# Patient Record
Sex: Female | Born: 1950
Health system: Southern US, Community
[De-identification: ages and names within clinical notes are randomized; demographics above are authoritative.]

## PROBLEM LIST (undated history)

## (undated) DIAGNOSIS — H9190 Unspecified hearing loss, unspecified ear: Secondary | ICD-10-CM

## (undated) DIAGNOSIS — E785 Hyperlipidemia, unspecified: Secondary | ICD-10-CM

## (undated) DIAGNOSIS — C801 Malignant (primary) neoplasm, unspecified: Secondary | ICD-10-CM

## (undated) DIAGNOSIS — F419 Anxiety disorder, unspecified: Secondary | ICD-10-CM

## (undated) DIAGNOSIS — I639 Cerebral infarction, unspecified: Secondary | ICD-10-CM

## (undated) HISTORY — PX: COLON SURGERY: SHX602

## (undated) HISTORY — PX: BREAST CYST EXCISION: SHX579

## (undated) HISTORY — DX: Hyperlipidemia, unspecified: E78.5

## (undated) HISTORY — DX: Malignant (primary) neoplasm, unspecified: C80.1

## (undated) HISTORY — PX: ABDOMINAL HYSTERECTOMY: SHX81

## (undated) HISTORY — DX: Cerebral infarction, unspecified: I63.9

---

## 1997-12-24 ENCOUNTER — Encounter: Payer: Self-pay | Admitting: Family Medicine

## 1997-12-24 ENCOUNTER — Ambulatory Visit (HOSPITAL_COMMUNITY): Admission: RE | Admit: 1997-12-24 | Discharge: 1997-12-24 | Payer: Self-pay | Admitting: Family Medicine

## 1997-12-28 ENCOUNTER — Ambulatory Visit (HOSPITAL_COMMUNITY): Admission: RE | Admit: 1997-12-28 | Discharge: 1997-12-28 | Payer: Self-pay | Admitting: *Deleted

## 1999-01-22 ENCOUNTER — Encounter: Admission: RE | Admit: 1999-01-22 | Discharge: 1999-01-22 | Payer: Self-pay | Admitting: Family Medicine

## 1999-06-16 ENCOUNTER — Ambulatory Visit (HOSPITAL_BASED_OUTPATIENT_CLINIC_OR_DEPARTMENT_OTHER): Admission: RE | Admit: 1999-06-16 | Discharge: 1999-06-16 | Payer: Self-pay | Admitting: Orthopedic Surgery

## 1999-11-05 ENCOUNTER — Encounter (INDEPENDENT_AMBULATORY_CARE_PROVIDER_SITE_OTHER): Payer: Self-pay | Admitting: *Deleted

## 1999-11-05 ENCOUNTER — Ambulatory Visit (HOSPITAL_COMMUNITY): Admission: RE | Admit: 1999-11-05 | Discharge: 1999-11-05 | Payer: Self-pay | Admitting: Gastroenterology

## 2000-06-18 ENCOUNTER — Encounter: Payer: Self-pay | Admitting: Family Medicine

## 2000-06-18 ENCOUNTER — Encounter: Admission: RE | Admit: 2000-06-18 | Discharge: 2000-06-18 | Payer: Self-pay | Admitting: Family Medicine

## 2000-06-19 ENCOUNTER — Emergency Department (HOSPITAL_COMMUNITY): Admission: EM | Admit: 2000-06-19 | Discharge: 2000-06-19 | Payer: Self-pay | Admitting: Emergency Medicine

## 2000-06-19 ENCOUNTER — Encounter: Payer: Self-pay | Admitting: Emergency Medicine

## 2002-09-26 ENCOUNTER — Ambulatory Visit (HOSPITAL_COMMUNITY): Admission: RE | Admit: 2002-09-26 | Discharge: 2002-09-26 | Payer: Self-pay | Admitting: Family Medicine

## 2002-09-26 ENCOUNTER — Encounter: Payer: Self-pay | Admitting: Family Medicine

## 2002-11-20 ENCOUNTER — Ambulatory Visit (HOSPITAL_COMMUNITY): Admission: RE | Admit: 2002-11-20 | Discharge: 2002-11-20 | Payer: Self-pay | Admitting: *Deleted

## 2002-11-20 ENCOUNTER — Encounter (INDEPENDENT_AMBULATORY_CARE_PROVIDER_SITE_OTHER): Payer: Self-pay

## 2003-04-03 ENCOUNTER — Encounter: Admission: RE | Admit: 2003-04-03 | Discharge: 2003-04-03 | Payer: Self-pay | Admitting: Family Medicine

## 2004-07-18 ENCOUNTER — Encounter: Admission: RE | Admit: 2004-07-18 | Discharge: 2004-07-18 | Payer: Self-pay | Admitting: Family Medicine

## 2004-10-29 ENCOUNTER — Emergency Department (HOSPITAL_COMMUNITY): Admission: EM | Admit: 2004-10-29 | Discharge: 2004-10-29 | Payer: Self-pay | Admitting: Emergency Medicine

## 2005-04-20 ENCOUNTER — Ambulatory Visit (HOSPITAL_COMMUNITY): Admission: RE | Admit: 2005-04-20 | Discharge: 2005-04-20 | Payer: Self-pay | Admitting: Family Medicine

## 2005-04-24 ENCOUNTER — Ambulatory Visit (HOSPITAL_COMMUNITY): Admission: RE | Admit: 2005-04-24 | Discharge: 2005-04-24 | Payer: Self-pay | Admitting: Family Medicine

## 2005-08-18 ENCOUNTER — Encounter: Admission: RE | Admit: 2005-08-18 | Discharge: 2005-08-18 | Payer: Self-pay | Admitting: Family Medicine

## 2006-06-07 ENCOUNTER — Encounter: Admission: RE | Admit: 2006-06-07 | Discharge: 2006-06-07 | Payer: Self-pay | Admitting: Gastroenterology

## 2007-05-10 ENCOUNTER — Encounter: Admission: RE | Admit: 2007-05-10 | Discharge: 2007-05-10 | Payer: Self-pay | Admitting: Family Medicine

## 2007-09-18 ENCOUNTER — Emergency Department (HOSPITAL_COMMUNITY): Admission: EM | Admit: 2007-09-18 | Discharge: 2007-09-18 | Payer: Self-pay | Admitting: Emergency Medicine

## 2008-04-27 ENCOUNTER — Emergency Department (HOSPITAL_COMMUNITY): Admission: EM | Admit: 2008-04-27 | Discharge: 2008-04-27 | Payer: Self-pay | Admitting: *Deleted

## 2008-05-06 ENCOUNTER — Encounter: Admission: RE | Admit: 2008-05-06 | Discharge: 2008-05-06 | Payer: Self-pay | Admitting: Otolaryngology

## 2008-06-05 ENCOUNTER — Encounter: Admission: RE | Admit: 2008-06-05 | Discharge: 2008-06-05 | Payer: Self-pay | Admitting: Family Medicine

## 2008-10-09 ENCOUNTER — Ambulatory Visit (HOSPITAL_COMMUNITY): Admission: RE | Admit: 2008-10-09 | Discharge: 2008-10-09 | Payer: Self-pay | Admitting: Family Medicine

## 2008-12-24 ENCOUNTER — Emergency Department (HOSPITAL_COMMUNITY): Admission: EM | Admit: 2008-12-24 | Discharge: 2008-12-24 | Payer: Self-pay | Admitting: Emergency Medicine

## 2009-06-27 ENCOUNTER — Encounter: Admission: RE | Admit: 2009-06-27 | Discharge: 2009-06-27 | Payer: Self-pay | Admitting: Family Medicine

## 2009-11-11 ENCOUNTER — Encounter (INDEPENDENT_AMBULATORY_CARE_PROVIDER_SITE_OTHER): Payer: Self-pay | Admitting: General Surgery

## 2009-11-11 ENCOUNTER — Inpatient Hospital Stay (HOSPITAL_COMMUNITY): Admission: RE | Admit: 2009-11-11 | Discharge: 2009-11-15 | Payer: Self-pay | Admitting: General Surgery

## 2009-11-11 DIAGNOSIS — C801 Malignant (primary) neoplasm, unspecified: Secondary | ICD-10-CM

## 2009-11-11 HISTORY — DX: Malignant (primary) neoplasm, unspecified: C80.1

## 2009-11-26 ENCOUNTER — Ambulatory Visit: Payer: Self-pay | Admitting: Hematology and Oncology

## 2009-12-04 LAB — CBC WITH DIFFERENTIAL/PLATELET
Basophils Absolute: 0.1 10*3/uL (ref 0.0–0.1)
EOS%: 0.9 % (ref 0.0–7.0)
Eosinophils Absolute: 0.1 10*3/uL (ref 0.0–0.5)
HGB: 13.7 g/dL (ref 11.6–15.9)
LYMPH%: 25.3 % (ref 14.0–49.7)
MCH: 30.4 pg (ref 25.1–34.0)
MCV: 89.7 fL (ref 79.5–101.0)
MONO%: 4.2 % (ref 0.0–14.0)
Platelets: 341 10*3/uL (ref 145–400)
RBC: 4.5 10*6/uL (ref 3.70–5.45)
RDW: 13.3 % (ref 11.2–14.5)

## 2009-12-04 LAB — COMPREHENSIVE METABOLIC PANEL
AST: 15 U/L (ref 0–37)
Albumin: 4.2 g/dL (ref 3.5–5.2)
Alkaline Phosphatase: 47 U/L (ref 39–117)
BUN: 9 mg/dL (ref 6–23)
Glucose, Bld: 90 mg/dL (ref 70–99)
Potassium: 3.6 mEq/L (ref 3.5–5.3)
Total Bilirubin: 0.8 mg/dL (ref 0.3–1.2)

## 2009-12-16 ENCOUNTER — Ambulatory Visit (HOSPITAL_COMMUNITY): Admission: RE | Admit: 2009-12-16 | Discharge: 2009-12-16 | Payer: Self-pay | Admitting: Hematology and Oncology

## 2010-03-16 ENCOUNTER — Encounter: Payer: Self-pay | Admitting: Family Medicine

## 2010-05-08 LAB — CBC
HCT: 38.4 % (ref 36.0–46.0)
Hemoglobin: 12.2 g/dL (ref 12.0–15.0)
Hemoglobin: 12.4 g/dL (ref 12.0–15.0)
MCH: 29.7 pg (ref 26.0–34.0)
MCH: 30.6 pg (ref 26.0–34.0)
MCHC: 31.6 g/dL (ref 30.0–36.0)
MCHC: 33.6 g/dL (ref 30.0–36.0)
MCV: 93.7 fL (ref 78.0–100.0)
Platelets: 213 10*3/uL (ref 150–400)
Platelets: 252 10*3/uL (ref 150–400)
RBC: 4.1 MIL/uL (ref 3.87–5.11)
RDW: 13.5 % (ref 11.5–15.5)

## 2010-05-08 LAB — DIFFERENTIAL
Basophils Relative: 0 % (ref 0–1)
Eosinophils Absolute: 0 10*3/uL (ref 0.0–0.7)
Eosinophils Absolute: 0 10*3/uL (ref 0.0–0.7)
Eosinophils Relative: 0 % (ref 0–5)
Eosinophils Relative: 1 % (ref 0–5)
Lymphocytes Relative: 33 % (ref 12–46)
Lymphs Abs: 2 10*3/uL (ref 0.7–4.0)
Lymphs Abs: 3.4 10*3/uL (ref 0.7–4.0)
Monocytes Absolute: 0.6 10*3/uL (ref 0.1–1.0)
Monocytes Absolute: 0.7 10*3/uL (ref 0.1–1.0)
Monocytes Absolute: 0.6 10*3/uL (ref 0.1–1.0)
Monocytes Relative: 6 % (ref 3–12)
Monocytes Relative: 6 % (ref 3–12)
Neutrophils Relative %: 74 % (ref 43–77)
Neutrophils Relative %: 76 % (ref 43–77)

## 2010-05-08 LAB — BASIC METABOLIC PANEL
BUN: 2 mg/dL — ABNORMAL LOW (ref 6–23)
CO2: 28 mEq/L (ref 19–32)
CO2: 29 mEq/L (ref 19–32)
Calcium: 8.7 mg/dL (ref 8.4–10.5)
Chloride: 103 mEq/L (ref 96–112)
GFR calc Af Amer: >60 mL/min (ref >60)
Glucose, Bld: 113 mg/dL — ABNORMAL HIGH (ref 70–99)
Glucose, Bld: 117 mg/dL — ABNORMAL HIGH (ref 70–99)
Potassium: 3.9 mEq/L (ref 3.5–5.1)
Sodium: 138 mEq/L (ref 135–145)
Sodium: 141 mEq/L (ref 135–145)

## 2010-05-08 LAB — COMPREHENSIVE METABOLIC PANEL
AST: 18 U/L (ref 0–37)
Albumin: 4.4 g/dL (ref 3.5–5.2)
Calcium: 10.6 mg/dL — ABNORMAL HIGH (ref 8.4–10.5)
Creatinine, Ser: 0.97 mg/dL (ref 0.4–1.2)
GFR calc Af Amer: >60 mL/min (ref >60)

## 2010-05-08 LAB — SURGICAL PCR SCREEN: Staphylococcus aureus: NEGATIVE

## 2010-05-29 ENCOUNTER — Other Ambulatory Visit: Payer: Self-pay | Admitting: Gastroenterology

## 2010-06-05 LAB — DIFFERENTIAL
Basophils Absolute: 0 10*3/uL (ref 0.0–0.1)
Basophils Relative: 1 % (ref 0–1)
Eosinophils Absolute: 0.1 10*3/uL (ref 0.0–0.7)
Lymphs Abs: 3.2 10*3/uL (ref 0.7–4.0)
Neutrophils Relative %: 53 % (ref 43–77)

## 2010-06-05 LAB — URINALYSIS, ROUTINE W REFLEX MICROSCOPIC
Bilirubin Urine: NEGATIVE
Glucose, UA: NEGATIVE mg/dL
Ketones, ur: NEGATIVE mg/dL
Protein, ur: NEGATIVE mg/dL
pH: 6 (ref 5.0–8.0)

## 2010-06-05 LAB — URINE MICROSCOPIC-ADD ON

## 2010-06-05 LAB — CBC
Hemoglobin: 13.9 g/dL (ref 12.0–15.0)
MCHC: 33.9 g/dL (ref 30.0–36.0)
MCV: 90 fL (ref 78.0–100.0)
RBC: 4.54 MIL/uL (ref 3.87–5.11)

## 2010-06-05 LAB — COMPREHENSIVE METABOLIC PANEL
ALT: 11 U/L (ref 0–35)
CO2: 27 mEq/L (ref 19–32)
Calcium: 9 mg/dL (ref 8.4–10.5)
Chloride: 104 mEq/L (ref 96–112)
GFR calc non Af Amer: >60 mL/min (ref >60)
Glucose, Bld: 94 mg/dL (ref 70–99)
Sodium: 138 mEq/L (ref 135–145)
Total Bilirubin: 0.3 mg/dL (ref 0.3–1.2)

## 2010-06-05 LAB — PROTIME-INR
INR: 1 (ref 0.00–1.49)
Prothrombin Time: 13.1 seconds (ref 11.6–15.2)

## 2010-06-05 LAB — URINE CULTURE: Colony Count: NO GROWTH

## 2010-07-01 ENCOUNTER — Other Ambulatory Visit: Payer: Self-pay | Admitting: Family Medicine

## 2010-07-01 DIAGNOSIS — Z1231 Encounter for screening mammogram for malignant neoplasm of breast: Secondary | ICD-10-CM

## 2010-07-08 ENCOUNTER — Ambulatory Visit
Admission: RE | Admit: 2010-07-08 | Discharge: 2010-07-08 | Disposition: A | Discharge status: Home / Self Care | Payer: BC Managed Care – PPO | Source: Ambulatory Visit | Attending: Family Medicine | Admitting: Family Medicine

## 2010-07-08 DIAGNOSIS — Z1231 Encounter for screening mammogram for malignant neoplasm of breast: Secondary | ICD-10-CM

## 2010-07-11 NOTE — Op Note (Signed)
[place]. [facility]  Patient:    Sonia Oliver, Sonia Oliver                        MRN: [id number] Proc. Date: 06/16/99 Adm. Date:  [id number] Attending:  Twana First                           Operative Report  PREOPERATIVE DIAGNOSIS:   Left medial meniscus tear.  POSTOPERATIVE DIAGNOSIS: 1. Left knee meniscus tear. 1. Left knee patellofemoral chondromalacia.  OPERATION:   Left knee examination under anesthesia followed by arthroscopic partial medial meniscectomy with patellofemoral chondroplasty.  SURGEON:  Elana Alm. Thurston Hole, M.D.  ASSISTANT:  Kirstin Adelberger, P.A.  ANESTHESIA:  Local and MAC.  OPERATIVE TIME:  30 minutes.  COMPLICATIONS:  None.  INDICATIONS FOR PROCEDURE:  [name] is a 60 year old woman who has had significantly increasing left knee pain over the past three to four months secondary to a squatting episode with signs and symptoms consistent with a medial meniscus tear and has failed conservative care and is now to undergo arthroscopy.  DESCRIPTION OF PROCEDURE:  [name] was brought to the operating room on June 16, 1999, after a block had been placed in the holding room.  She was placed on the operating table in a supine position.  After an adequate level of general anesthesia was obtained, her left knee was examined under anesthesia.  Range of motion from 0 to 125 degrees, 1+ ______ , stable ligamentous exam with normal patella tracking.  After this was done, the knee was prepped using sterile Betadine and draped using sterile technique.  Originally through an inferolateral portal, the arthroscope with pump attached was placed into an inferomedial portal, and arthroscopic probe was placed.  On initial inspection of the medial compartment, the articular cartilage of mediofemoral condyle, medial tibial plateau was intact.  Medial meniscus showed a split complex tear of the posterior and medial horn of the medial meniscus of  which 30 to 40% was resected back to a stable rim.  The area of condylar notch was inspected, anterior and posterior cruciate ligaments were normal.  Lateral compartment was inspected.  Articular cartilage, lateral femoral condyle, and lateral tibial plateau was normal.  Lateral meniscus was probed, and this was found to be normal.  Patellofemoral joint was inspected.  The articular cartilage on the patellofemoral joint showed a grade III chondral lesion over the medial patellar facet articulating with another grade III lesion over the mediofemoral condyle at about 60 to 80 degrees of flexion.  This was thoroughly debrided down to a stable base and a stable rim.  The rest of the articular cartilage in the patellofemoral joint was normal.  Moderate synovitis in the mediolateral gutters were debrided.  Otherwise, they were free of pathology.  After this was done, it was felt that all pathology had been satisfactorily addressed.  The instruments were removed.  Portals were closed with 3-0 nylon suture and injected with 0.25% Marcaine with epinephrine and 5 mg of morphine.  Sterile dressing was applied, and the patient was awakened and taken to the recovery room in stable condition.  FOLLOWUP CARE:  [name] will be followed as an outpatient on Vicodin and Naprosyn.  She will be back in the office in a week for sutures out and followup. DD:  06/16/99 TD:  06/16/99 Job: [id number]

## 2010-07-11 NOTE — Op Note (Signed)
[place]. [facility]  Patient:    MELONI, HINZ                        MRN: [id number] Proc. Date: 11/05/99 Adm. Date:  [id number] Attending:  Charna Elizabeth CC:         Adolph Pollack, M.D.   Operative Report  DATE OF BIRTH:  [birth year].  PROCEDURE PERFORMED:  Colonoscopy with snare polypectomy x 1.  ENDOSCOPIST:  Anselmo Rod, M.D.  INSTRUMENTS USED:  Olympus video colonoscope.  INDICATIONS FOR PROCEDURE:  Change in bowel habits and a previous history of polyps removed three years ago.  Rule out recurrent polyps.  PREPROCEDURE PREPARATION:  Informed consent was procured from the patient. The patient was fasted for eight hours prior to the procedure and prepped with a bottle of magnesium citrate and a gallon of NuLytely the night prior to the procedure.  PREPROCEDURE PHYSICAL:  VITAL SIGNS:  The patient had stable vital signs.  NECK:  Supple.  CHEST:  Clear to auscultation.  S1, S2 regular.  No murmur, rub, or gallop, rales, rhonchi, or wheezing.  ABDOMEN:  Soft with normal abdominal bowel sounds.  DESCRIPTION OF PROCEDURE:  The patient was placed in the left lateral decubitus position and sedated with 50 mg of Demerol and 5 mg of Versed intravenously.  Once the patient was adequately sedate and maintained on low-flow oxygen and continuous cardiac monitoring, the Olympus video colonoscope was advanced from the rectum to the cecum with slight difficulty secondary to some residual stool in the right colon and cecum.  A small sessile polyp was removed from 50 cm via snare polypectomy.  No large masses or polyps were seen.  The procedure was completed through the cecum.  A very small lesion may have been missed in the right colon secondary to a relatively poor prep.  IMPRESSION: 1. Small sessile polyp snared at 50 cm. 2. No large masses or polyps seen. 3. Some residual stool in the right colon and cecum.  Very small lesion may  have been missed.  RECOMMENDATIONS: 1. Await pathology results. 2. Outpatient followup in the next two weeks. 3. Repeat colorectal cancer screening to be scheduled in the near future    depending on the pathology results. DD:  11/05/99 TD:  11/06/99 Job: [id number]

## 2010-11-21 LAB — URINALYSIS, ROUTINE W REFLEX MICROSCOPIC
Glucose, UA: NEGATIVE
Ketones, ur: NEGATIVE
Protein, ur: NEGATIVE
pH: 6.5

## 2010-11-21 LAB — COMPREHENSIVE METABOLIC PANEL
ALT: 12
AST: 15
CO2: 26
Calcium: 9.4
Chloride: 103
GFR calc Af Amer: >60
GFR calc non Af Amer: >60
Glucose, Bld: 98
Sodium: 137
Total Bilirubin: 0.4

## 2010-11-21 LAB — DIFFERENTIAL
Basophils Absolute: 0
Basophils Relative: 1
Eosinophils Absolute: 0.1
Eosinophils Relative: 1
Lymphs Abs: 2
Neutrophils Relative %: 67

## 2010-11-21 LAB — CBC
Hemoglobin: 13.3
MCHC: 33.4
MCV: 91
RBC: 4.37
WBC: 7.5

## 2010-11-21 LAB — URINE MICROSCOPIC-ADD ON

## 2010-12-22 ENCOUNTER — Encounter (INDEPENDENT_AMBULATORY_CARE_PROVIDER_SITE_OTHER): Payer: Self-pay | Admitting: General Surgery

## 2011-06-03 ENCOUNTER — Ambulatory Visit (HOSPITAL_COMMUNITY)
Admission: RE | Admit: 2011-06-03 | Discharge: 2011-06-03 | Disposition: A | Discharge status: Home / Self Care | Payer: Medicare Other | Source: Ambulatory Visit | Attending: Family Medicine | Admitting: Family Medicine

## 2011-06-03 ENCOUNTER — Other Ambulatory Visit (HOSPITAL_COMMUNITY): Payer: Self-pay | Admitting: Family Medicine

## 2011-06-03 DIAGNOSIS — Z23 Encounter for immunization: Secondary | ICD-10-CM | POA: Diagnosis not present

## 2011-06-03 DIAGNOSIS — F17200 Nicotine dependence, unspecified, uncomplicated: Secondary | ICD-10-CM | POA: Diagnosis not present

## 2011-06-03 DIAGNOSIS — Z85038 Personal history of other malignant neoplasm of large intestine: Secondary | ICD-10-CM | POA: Diagnosis not present

## 2011-06-03 DIAGNOSIS — S61209A Unspecified open wound of unspecified finger without damage to nail, initial encounter: Secondary | ICD-10-CM | POA: Diagnosis not present

## 2011-06-03 DIAGNOSIS — R079 Chest pain, unspecified: Secondary | ICD-10-CM

## 2011-06-03 DIAGNOSIS — IMO0002 Reserved for concepts with insufficient information to code with codable children: Secondary | ICD-10-CM | POA: Diagnosis not present

## 2011-06-04 DIAGNOSIS — I10 Essential (primary) hypertension: Secondary | ICD-10-CM | POA: Diagnosis not present

## 2011-06-04 DIAGNOSIS — R0789 Other chest pain: Secondary | ICD-10-CM | POA: Diagnosis not present

## 2011-07-10 ENCOUNTER — Other Ambulatory Visit: Payer: Self-pay | Admitting: Family Medicine

## 2011-07-10 DIAGNOSIS — Z1231 Encounter for screening mammogram for malignant neoplasm of breast: Secondary | ICD-10-CM

## 2011-07-10 DIAGNOSIS — Z803 Family history of malignant neoplasm of breast: Secondary | ICD-10-CM

## 2011-07-24 ENCOUNTER — Ambulatory Visit
Admission: RE | Admit: 2011-07-24 | Discharge: 2011-07-24 | Disposition: A | Discharge status: Home / Self Care | Payer: Medicare Other | Source: Ambulatory Visit | Attending: Family Medicine | Admitting: Family Medicine

## 2011-07-24 DIAGNOSIS — Z803 Family history of malignant neoplasm of breast: Secondary | ICD-10-CM

## 2011-07-24 DIAGNOSIS — Z1231 Encounter for screening mammogram for malignant neoplasm of breast: Secondary | ICD-10-CM

## 2012-05-30 DIAGNOSIS — K5730 Diverticulosis of large intestine without perforation or abscess without bleeding: Secondary | ICD-10-CM | POA: Diagnosis not present

## 2012-05-30 DIAGNOSIS — Z85038 Personal history of other malignant neoplasm of large intestine: Secondary | ICD-10-CM | POA: Diagnosis not present

## 2012-05-30 DIAGNOSIS — Z09 Encounter for follow-up examination after completed treatment for conditions other than malignant neoplasm: Secondary | ICD-10-CM | POA: Diagnosis not present

## 2012-08-09 DIAGNOSIS — H6980 Other specified disorders of Eustachian tube, unspecified ear: Secondary | ICD-10-CM | POA: Diagnosis not present

## 2012-08-09 DIAGNOSIS — IMO0002 Reserved for concepts with insufficient information to code with codable children: Secondary | ICD-10-CM | POA: Diagnosis not present

## 2012-08-09 DIAGNOSIS — J019 Acute sinusitis, unspecified: Secondary | ICD-10-CM | POA: Diagnosis not present

## 2012-09-05 ENCOUNTER — Other Ambulatory Visit: Payer: Self-pay

## 2012-09-05 DIAGNOSIS — Z1231 Encounter for screening mammogram for malignant neoplasm of breast: Secondary | ICD-10-CM

## 2012-09-22 ENCOUNTER — Ambulatory Visit
Admission: RE | Admit: 2012-09-22 | Discharge: 2012-09-22 | Disposition: A | Discharge status: Home / Self Care | Payer: Medicare Other | Source: Ambulatory Visit

## 2012-09-22 DIAGNOSIS — Z1231 Encounter for screening mammogram for malignant neoplasm of breast: Secondary | ICD-10-CM

## 2012-12-04 DIAGNOSIS — Z23 Encounter for immunization: Secondary | ICD-10-CM | POA: Diagnosis not present

## 2013-03-22 DIAGNOSIS — IMO0002 Reserved for concepts with insufficient information to code with codable children: Secondary | ICD-10-CM | POA: Diagnosis not present

## 2013-03-22 DIAGNOSIS — M67919 Unspecified disorder of synovium and tendon, unspecified shoulder: Secondary | ICD-10-CM | POA: Diagnosis not present

## 2013-03-22 DIAGNOSIS — M7500 Adhesive capsulitis of unspecified shoulder: Secondary | ICD-10-CM | POA: Diagnosis not present

## 2013-05-25 DIAGNOSIS — F4321 Adjustment disorder with depressed mood: Secondary | ICD-10-CM | POA: Diagnosis not present

## 2013-05-25 DIAGNOSIS — Z01419 Encounter for gynecological examination (general) (routine) without abnormal findings: Secondary | ICD-10-CM | POA: Diagnosis not present

## 2013-05-25 DIAGNOSIS — IMO0002 Reserved for concepts with insufficient information to code with codable children: Secondary | ICD-10-CM | POA: Diagnosis not present

## 2013-05-25 DIAGNOSIS — Z79899 Other long term (current) drug therapy: Secondary | ICD-10-CM | POA: Diagnosis not present

## 2013-05-25 DIAGNOSIS — Z0000 Encounter for general adult medical examination without abnormal findings: Secondary | ICD-10-CM | POA: Diagnosis not present

## 2013-07-19 DIAGNOSIS — IMO0002 Reserved for concepts with insufficient information to code with codable children: Secondary | ICD-10-CM | POA: Diagnosis not present

## 2013-07-19 DIAGNOSIS — M25519 Pain in unspecified shoulder: Secondary | ICD-10-CM | POA: Diagnosis not present

## 2013-08-01 ENCOUNTER — Encounter: Payer: Self-pay | Admitting: Orthopedic Surgery

## 2013-08-01 ENCOUNTER — Ambulatory Visit (INDEPENDENT_AMBULATORY_CARE_PROVIDER_SITE_OTHER): Payer: Medicare Other | Admitting: Orthopedic Surgery

## 2013-08-01 ENCOUNTER — Ambulatory Visit (INDEPENDENT_AMBULATORY_CARE_PROVIDER_SITE_OTHER): Payer: Medicare Other

## 2013-08-01 VITALS — BP 135/70

## 2013-08-01 DIAGNOSIS — M25519 Pain in unspecified shoulder: Secondary | ICD-10-CM

## 2013-08-01 DIAGNOSIS — M7500 Adhesive capsulitis of unspecified shoulder: Secondary | ICD-10-CM

## 2013-08-01 DIAGNOSIS — M25512 Pain in left shoulder: Secondary | ICD-10-CM

## 2013-08-01 NOTE — Patient Instructions (Addendum)
You Can Quit Smoking If you are ready to quit smoking or are thinking about it, congratulations! You have chosen to help yourself be healthier and live longer! There are lots of different ways to quit smoking. Nicotine gum, nicotine patches, a nicotine inhaler, or nicotine nasal spray can help with physical craving. Hypnosis, support groups, and medicines help break the habit of smoking. TIPS TO GET OFF AND STAY OFF CIGARETTES  Learn to predict your moods. Do not let a bad situation be your excuse to have a cigarette. Some situations in your life might tempt you to have a cigarette.  Ask friends and co-workers not to smoke around you.  Make your home smoke-free.  Never have "just one" cigarette. It leads to wanting another and another. Remind yourself of your decision to quit.  On a card, make a list of your reasons for not smoking. Read it at least the same number of times a day as you have a cigarette. Tell yourself everyday, "I do not want to smoke. I choose not to smoke."  Ask someone at home or work to help you with your plan to quit smoking.  Have something planned after you eat or have a cup of coffee. Take a walk or get other exercise to perk you up. This will help to keep you from overeating.  Try a relaxation exercise to calm you down and decrease your stress. Remember, you may be tense and nervous the first two weeks after you quit. This will pass.  Find new activities to keep your hands busy. Play with a pen, coin, or rubber band. Doodle or draw things on paper.  Brush your teeth right after eating. This will help cut down the craving for the taste of tobacco after meals. You can try mouthwash too.  Try gum, breath mints, or diet candy to keep something in your mouth. IF YOU SMOKE AND WANT TO QUIT:  Do not stock up on cigarettes. Never buy a carton. Wait until one pack is finished before you buy another.  Never carry cigarettes with you at work or at home.  Keep cigarettes  as far away from you as possible. Leave them with someone else.  Never carry matches or a lighter with you.  Ask yourself, "Do I need this cigarette or is this just a reflex?"  Bet with someone that you can quit. Put cigarette money in a piggy bank every morning. If you smoke, you give up the money. If you do not smoke, by the end of the week, you keep the money.  Keep trying. It takes 21 days to change a habit!  Talk to your doctor about using medicines to help you quit. These include nicotine replacement gum, lozenges, or skin patches. Document Released: 12/06/2008 Document Revised: 05/04/2011 Document Reviewed: 12/06/2008 [facility] Patient Information 2014 [place].  Impingement Syndrome, Rotator Cuff, Bursitis  Impingement syndrome is a condition that involves inflammation of the tendons of the rotator cuff and the subacromial bursa, that causes pain in the shoulder. The rotator cuff consists of four tendons and muscles that control much of the shoulder and upper arm function. The subacromial bursa is a fluid filled sac that helps reduce friction between the rotator cuff and one of the bones of the shoulder (acromion). Impingement syndrome is usually an overuse injury that causes swelling of the bursa (bursitis), swelling of the tendon (tendonitis), and/or a tear of the tendon (strain). Strains are classified into three categories. Grade 1 strains cause pain,  but the tendon is not lengthened. Grade 2 strains include a lengthened ligament, due to the ligament being stretched or partially ruptured. With grade 2 strains there is still function, although the function may be decreased. Grade 3 strains include a complete tear of the tendon or muscle, and function is usually impaired. SYMPTOMS   Pain around the shoulder, often at the outer portion of the upper arm.  Pain that gets worse with shoulder function, especially when reaching overhead or lifting.  Sometimes, aching when not using  the arm.  Pain that wakes you up at night.  Sometimes, tenderness, swelling, warmth, or redness over the affected area.  Loss of strength.  Limited motion of the shoulder, especially reaching behind the back (to the back pocket or to unhook bra) or across your body.  Crackling sound (crepitation) when moving the arm.  Biceps tendon pain and inflammation (in the front of the shoulder). Worse when bending the elbow or lifting. CAUSES  Impingement syndrome is often an overuse injury, in which chronic (repetitive) motions cause the tendons or bursa to become inflamed. A strain occurs when a force is paced on the tendon or muscle that is greater than it can withstand. Common mechanisms of injury include: Stress from sudden increase in duration, frequency, or intensity of training.  Direct hit (trauma) to the shoulder.  Aging, erosion of the tendon with normal use.  Bony bump on shoulder (acromial spur). RISK INCREASES WITH:  Contact sports (football, wrestling, boxing).  Throwing sports (baseball, tennis, volleyball).  Weightlifting and bodybuilding.  Heavy labor.  Previous injury to the rotator cuff, including impingement.  Poor shoulder strength and flexibility.  Failure to warm up properly before activity.  Inadequate protective equipment.  Old age.  Bony bump on shoulder (acromial spur). PREVENTION   Warm up and stretch properly before activity.  Allow for adequate recovery between workouts.  Maintain physical fitness:  Strength, flexibility, and endurance.  Cardiovascular fitness.  Learn and use proper exercise technique. PROGNOSIS  If treated properly, impingement syndrome usually goes away within 6 weeks. Sometimes surgery is required.  RELATED COMPLICATIONS   Longer healing time if not properly treated, or if not given enough time to heal.  Recurring symptoms, that result in a chronic condition.  Shoulder stiffness, frozen shoulder, or loss of  motion.  Rotator cuff tendon tear.  Recurring symptoms, especially if activity is resumed too soon, with overuse, with a direct blow, or when using poor technique. TREATMENT  Treatment first involves the use of ice and medicine, to reduce pain and inflammation. The use of strengthening and stretching exercises may help reduce pain with activity. These exercises may be performed at home or with a therapist. If non-surgical treatment is unsuccessful after more than 6 months, surgery may be advised. After surgery and rehabilitation, activity is usually possible in 3 months.  MEDICATION  If pain medicine is needed, nonsteroidal anti-inflammatory medicines (aspirin and ibuprofen), or other minor pain relievers (acetaminophen), are often advised.  Do not take pain medicine for 7 days before surgery.  Prescription pain relievers may be given, if your caregiver thinks they are needed. Use only as directed and only as much as you need.  Corticosteroid injections may be given by your caregiver. These injections should be reserved for the most serious cases, because they may only be given a certain number of times. HEAT AND COLD  Cold treatment (icing) should be applied for 10 to 15 minutes every 2 to 3 hours for inflammation and  pain, and immediately after activity that aggravates your symptoms. Use ice packs or an ice massage.  Heat treatment may be used before performing stretching and strengthening activities prescribed by your caregiver, physical therapist, or athletic trainer. Use a heat pack or a warm water soak. SEEK MEDICAL CARE IF:   Symptoms get worse or do not improve in 4 to 6 weeks, despite treatment.  New, unexplained symptoms develop. (Drugs used in treatment may produce side effects.)  Call to arrange therapy Do home exercises for 6 weeks

## 2013-08-01 NOTE — Progress Notes (Signed)
Patient ID: Sonia Oliver, female   DOB: 1950-08-18, 63 y.o.   MRN: [id number]  Chief Complaint  Patient presents with  . Shoulder Pain    left shoulder pain. Referred by Delman Cheadle, PA    History this is a 63 year old female who presents with a five-month history of atraumatic onset of left shoulder pain. She is having or experiencing pain and stiffness and dull aching over the left anterior and lateral shoulder which has become constant. Pain intensity is 5/10. She's had minimal relief from 2-3 months of naproxen 500 mg twice a day. She does have night pain and increased pain with cold weather. She has decreased internal rotation as well as forward elevation.  Medical history includes a history of gastroesophageal reflux disease and cancer of the colon. She had a partial hysterectomy and 75 had colon surgery in 2011 show cystectomy of the left breast.  Current medications include naproxen 500 mg, alprazolam 0.5 mg half tab at bedtime aspirin 81 mg daily. Allegra 180 mg a day cholesterol 900 mg and vitamin D 3. Allergies to medication include sulfa which gives her a sick feeling codeine which affects her breathing  Family history of diabetes ulcers gastrointestinal disease heart disease hypertension stroke heart attack coronary artery disease cancer arthritis osteoporosis seizure disorder depression thyroid disease  Review of systems she does have some difficulty sleeping trouble with her hearing some sinusitis problems. She has anxiety and otherwise normal system review  Her height is 5 3-1/2 her weight 138 pounds BP 135/70  General appearance: Development, nutrition are normal. Body habitus medium No gross deformities are noted and grooming normal.  Peripheral vascular system no swelling or varicose veins are noted and pulses are palpable without tenderness, temperature warm to touch no edema.  No palpable lymph nodes are noted in the cervical area or axillae.  The skin overlying  the right and left shoulder cervical and thoracic spine is normal without rash, lesion or ulceration  Deep tendon reflexes are normal and equal. And pathologic reflexes such as Hoffman sign are negative.  Sensation remains normal.  The patient is oriented to person place and time, the mood and affect are normal  Ambulation remains normal  Cervical spine no mass or tenderness. Range of motion is normal. Muscle tone is normal. Skin is normal.  Right shoulder inspection reveals no tenderness or malalignment. There is no crepitation. The range of motion remains full flexion internal and external rotation. Stability tests are normal in abduction external rotation inferior subluxation test as well as the posterior stress test. Manual muscle testing of the supraspinatus, internal and external rotators 5 over 5  Impingement sign is normal, Hawkins maneuver normal, a.c. joint stress test normal.  There is tenderness around the anterior deltoid and the posterior aspect of the acromion, internal rotation is limited to her front pocket, forward elevation is limited. Internal rotation is limited to the front pocket The patient is stable in abduction external rotation. There is mild weakness of the supraspinatus tendon 4/5 with normal internal and external rotation strength are 5/5. The impingement sign is positive The a.c. joint stress test is normal A Hawkins maneuver is negative, normal  Medical decision making Data we ordered an x-ray was interpreted it is interpreted as  Diagnosis  Bursitis left shoulder  Resection x-ray, injection, home therapy.  Procedure inject subacromial space left shoulder Diagnosis rotator cuff syndrome left shoulder Medication Depo-Medrol 40 mg, 1 cc and lidocaine 1% 3 cc Verbal consent Timeout completed  The injection site was cleaned with alcohol and sprayed with ethyl chloride. From a posterior approach a 20-gauge needle was injected in the subacromial space.  The medication went in easily. There were no complications. The wound was covered with a sterile bandage. Appropriate precautions were given.   Followup if no improvement after 6 weeks

## 2013-08-07 ENCOUNTER — Ambulatory Visit (HOSPITAL_COMMUNITY): Payer: Medicare Other | Admitting: Specialist

## 2013-08-23 ENCOUNTER — Ambulatory Visit (HOSPITAL_COMMUNITY)
Admission: RE | Admit: 2013-08-23 | Discharge: 2013-08-23 | Disposition: A | Discharge status: Home / Self Care | Payer: Medicare Other | Source: Ambulatory Visit | Attending: Orthopedic Surgery | Admitting: Orthopedic Surgery

## 2013-08-23 DIAGNOSIS — M25519 Pain in unspecified shoulder: Secondary | ICD-10-CM | POA: Insufficient documentation

## 2013-08-23 DIAGNOSIS — M6281 Muscle weakness (generalized): Secondary | ICD-10-CM | POA: Diagnosis not present

## 2013-08-23 DIAGNOSIS — IMO0001 Reserved for inherently not codable concepts without codable children: Secondary | ICD-10-CM | POA: Diagnosis not present

## 2013-08-23 DIAGNOSIS — M25619 Stiffness of unspecified shoulder, not elsewhere classified: Secondary | ICD-10-CM | POA: Insufficient documentation

## 2013-08-23 DIAGNOSIS — M25612 Stiffness of left shoulder, not elsewhere classified: Secondary | ICD-10-CM | POA: Insufficient documentation

## 2013-08-23 NOTE — Evaluation (Signed)
Occupational Therapy Evaluation  Patient Details  Name: Sonia Oliver MRN: [id number] Date of Birth: 1950-06-04  Today's Date: 08/23/2013 Time: 1311-1350 OT Time Calculation (min): 39 min Eval 2'  Visit#: 1 of 8  Re-eval: 09/20/13  Assessment Diagnosis: Left Adhesive Capsulitis Surgical Date:  (n/a) Next MD Visit: July 21st  Authorization: Medicare, with no secondary insurance  Authorization Time Period: Before 10th visit  Authorization Visit#: 1 of 10   Past Medical History: No past medical history on file. Past Surgical History: No past surgical history on file.  Subjective Symptoms/Limitations Symptoms: "It was frozen back in January" Pertinent History: In january pt began expereincing a suddon loss of ROM in her left shoulder.  Pt was referred to Dr Aline Brochure, who gave pt cortizone injection and exercises for wall slides and pendulums.  Pt reports significnaly improved ROM since initla onset, but still has limitation.  Pt verbalizes inability tor each to the center of her back. Limitations: Reaching into flexion, abduction, IR/extension Special Tests: FOTO  Patient Stated Goals: "Get arm to functional fully" Pain Assessment Currently in Pain?: No/denies  Precautions/Restrictions  Precautions Precautions: None Restrictions Weight Bearing Restrictions: No  Balance Screening Balance Screen Has the patient fallen in the past 6 months: No  Prior [place] expects to be discharged to:: Private residence Living Arrangements: Spouse/significant other Available Help at Discharge: Family (Husband) Prior Function Level of Independence: Independent with basic ADLs;Independent with homemaking with ambulation Driving: Yes 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  Reaching to top shelves has improved. Dominant Hand: Right Vision - History Baseline Vision: Wears glasses only for reading  Cognition/Observation Cognition Overall Cognitive Status: Within Functional Limits for tasks assessed Arousal/Alertness: Awake/alert Orientation Level: Oriented X4  Sensation/Coordination/Edema Sensation Light Touch: Appears Intact Coordination Gross Motor Movements are Fluid and Coordinated: Yes Fine Motor Movements are Fluid and Coordinated: Yes  Additional Assessments LUE Assessment LUE Assessment: Exceptions to WFL LUE AROM (degrees) LUE Overall AROM Comments: Assessed in sitting, with ER/IR shoulder adducted Left Shoulder Flexion: 113 Degrees Left Shoulder ABduction: 105 Degrees Left Shoulder Internal Rotation: 99 Degrees Left Shoulder External Rotation: 29 Degrees LUE PROM (degrees) LUE Overall PROM Comments: Assess in supine, with ER/IR shoulder adducted Left Shoulder Flexion: 123 Degrees Left Shoulder ABduction: 73 Degrees Left Shoulder Internal Rotation: 101 Degrees Left Shoulder External Rotation: 34 Degrees LUE Strength LUE Overall Strength Comments: Assessed in sitting, with ER/IR shoulder adducted Left Shoulder Flexion: 3-/5 (5/5 strength within available range) Left Shoulder ABduction: 3-/5 (5/5 strength within available range) Left Shoulder Internal Rotation: 5/5 Left Shoulder External Rotation: 3-/5 (5/5 within available range) Palpation Palpation: Moderate fascial restrictions in left upper arm, upper trap, scapular, and anterior shoulder regions.    Occupational Therapy Assessment and Plan OT Assessment and Plan Clinical Impression Statement: Pt is presenting for outpatient OT services for adhesive capsulitis of her left shoulder.  Pt is experiencing decreased ROM, increased fascial restrictions, decreased strength, increased pain with movement, and decreased LUE functional use.  Pt is aso  very concerned about the cost of each tx session, due to only having Medicare to cover 80% of each visit.   Pt will benefit from skilled therapeutic intervention in order to improve on the following deficits: Increased fascial restricitons;Pain;Impaired UE functional use;Decreased strength;Decreased range of motion Rehab Potential: Good OT Frequency: Min 1X/week (May  transition to every other week, pending pt's decision about insurance coverage) OT Duration: 8 weeks OT Treatment/Interventions: Self-care/ADL training;Therapeutic exercise;Energy conservation;Manual therapy;Modalities;Therapeutic activities;Patient/family education OT Plan: P: Pt will benefit from skilled OT services to improve both PROM and AROM, decreased fascial restricions, decrease pain with movement, improve strength, and improve overall LUE functional use.    Treatment Plan: myofascial release and manual stretching, PROM, AAROM, AROM, general strengthening, proximal shoulder strengthening, and scapular stabilization.  Pt is interested in transitioning to HEP only plan, so consistent updates on HEP through treatment..    Goals Home Exercise Program Pt/caregiver will Perform Home Exercise Program: For increased ROM;For increased strengthening PT Goal: Perform Home Exercise Program - Progress: Goal set today Short Term Goals Time to Complete Short Term Goals: 4 weeks Short Term Goal 1: Pt will be educated on HEP Short Term Goal 2: Pt will improve LUE PROM to WNL for improved ability to reach to overhead shelves. Short Term Goal 3: Pt will decrease fascial restrictions to min for decreased pain with movement Short Term Goal 4: Pt will report pain of less than 3/10 with lifting her arm. Long Term Goals Long Term Goal 1: Pt will acheive highest level of functioning with all ADL, IADL, and leisure tasks Long Term Goal 2: Pt will improve LUE AROM to WNL for improved ability to reach into top kitchen shelves Long Term Goal 3: Pt will  decresase fascial restrictions to trace for decreased pain with ROM Long Term Goal 4: Pt will report pain of 1/10 or less with overhead reaching activities Long Term Goal 5: Pt will achieve strength of 5/5 in LUE for improved ability to engage in sustained overhead reaching activities. Additional Long Term Goals?: Yes Long Term Goal 6: Pt will report 75% improvement in ROM needed to wash her back using her LUE.  Problem List Patient Active Problem List   Diagnosis Date Noted  . Decreased range of motion of left shoulder 08/23/2013  . Muscle weakness (generalized) 08/23/2013    End of Session Activity Tolerance: Patient tolerated treatment well General Behavior During Therapy: WFL for tasks assessed/performed OT Plan of Care OT Home Exercise Plan: Towel Slides, seated dowel exercises, internal rotation towel stretch, and wall walks (with stretch) in flexion and abduction OT Patient Instructions: Explained and demonstrated.  Pt return demonstrated.  Handouts provided (scanned). Consulted and Agree with Plan of Care: Patient  GO Functional Assessment Tool Used: FOTO 59/100 (41% limited) Functional Limitation: Carrying, moving and handling objects Carrying, Moving and Handling Objects Current Status ([id number]): At least 40 percent but less than 60 percent impaired, limited or restricted Carrying, Moving and Handling Objects Goal Status [phone]): At least 20 percent but less than 40 percent impaired, limited or restricted  Bea Graff, [place]askins, OTR/L [phone]  08/23/2013, 3:50 PM   Physician Documentation Your signature is required to indicate approval of the treatment plan as stated above.  Please sign and either send electronically or make a copy of this report for your files and return this physician signed original.  Please mark one 1.__approve of plan  2. ___approve of plan with the following conditions.   ______________________________                                                           _____________________ Physician  Signature                                                                                                             Date SBNR

## 2013-08-30 ENCOUNTER — Telehealth (HOSPITAL_COMMUNITY): Payer: Self-pay

## 2013-08-31 ENCOUNTER — Inpatient Hospital Stay (HOSPITAL_COMMUNITY): Admission: RE | Admit: 2013-08-31 | Payer: Medicare Other | Source: Ambulatory Visit

## 2013-09-12 ENCOUNTER — Ambulatory Visit (INDEPENDENT_AMBULATORY_CARE_PROVIDER_SITE_OTHER): Payer: Medicare Other | Admitting: Orthopedic Surgery

## 2013-09-12 VITALS — BP 107/59 | Ht 64.0 in | Wt 130.0 lb

## 2013-09-12 DIAGNOSIS — J069 Acute upper respiratory infection, unspecified: Secondary | ICD-10-CM

## 2013-09-12 MED ORDER — AZITHROMYCIN 250 MG PO TABS
ORAL_TABLET | ORAL | Status: DC
Start: 2013-09-12 — End: 2014-06-22

## 2013-09-12 NOTE — Progress Notes (Signed)
Patient ID: Sonia Oliver, female   DOB: [birth year], 63 y.o.   MRN: [id number]  Chief Complaint  Patient presents with  . Follow-up    6 week recheck on left shoulder after injection and physical therapy.   BP 107/59  Ht 5\' 4"  (1.626 m)  Wt 130 lb (58.968 kg)  BMI 22.30 kg/m2  Her treatment adhesive capsulitis did well with home therapy after I injected her. That's doing great she's regained her motion she's been able to help with remodeling of her home her activities of daily living have improved back to normal however she's had a bout of upper respiratory cough congestion fever mucus production for the second time. She can get an appointment with her doctors. She had trouble breathing and some upper chest and sinus congestion  Is a significant cough  As far as the shoulder goes full range of motion normal strength stability scans intact good distal pulse normal sensation no tenderness or swelling  Impression  Encounter Diagnosis  Name Primary?  . Acute upper respiratory infections of unspecified site Yes   Adhesive capsulitis resolved  I discussed with her an antibiotic and I think Z-Pak is in order since she cannot see her doctor and she did have a fever and apical 2 breathing and productive cough she will still get an appointment with them. Shoulder followup as needed

## 2013-09-20 ENCOUNTER — Other Ambulatory Visit (HOSPITAL_COMMUNITY): Payer: Self-pay | Admitting: Family Medicine

## 2013-09-20 ENCOUNTER — Encounter (INDEPENDENT_AMBULATORY_CARE_PROVIDER_SITE_OTHER): Payer: Self-pay

## 2013-09-20 ENCOUNTER — Ambulatory Visit (HOSPITAL_COMMUNITY)
Admission: RE | Admit: 2013-09-20 | Discharge: 2013-09-20 | Disposition: A | Discharge status: Home / Self Care | Payer: Medicare Other | Source: Ambulatory Visit | Attending: Family Medicine | Admitting: Family Medicine

## 2013-09-20 DIAGNOSIS — F411 Generalized anxiety disorder: Secondary | ICD-10-CM | POA: Diagnosis not present

## 2013-09-20 DIAGNOSIS — J209 Acute bronchitis, unspecified: Secondary | ICD-10-CM

## 2013-09-20 DIAGNOSIS — R05 Cough: Secondary | ICD-10-CM | POA: Insufficient documentation

## 2013-09-20 DIAGNOSIS — R079 Chest pain, unspecified: Secondary | ICD-10-CM | POA: Insufficient documentation

## 2013-09-20 DIAGNOSIS — R059 Cough, unspecified: Secondary | ICD-10-CM | POA: Insufficient documentation

## 2013-09-20 DIAGNOSIS — IMO0002 Reserved for concepts with insufficient information to code with codable children: Secondary | ICD-10-CM | POA: Diagnosis not present

## 2013-09-29 ENCOUNTER — Other Ambulatory Visit: Payer: Self-pay

## 2013-09-29 DIAGNOSIS — Z1231 Encounter for screening mammogram for malignant neoplasm of breast: Secondary | ICD-10-CM

## 2013-10-10 ENCOUNTER — Ambulatory Visit
Admission: RE | Admit: 2013-10-10 | Discharge: 2013-10-10 | Disposition: A | Discharge status: Home / Self Care | Payer: Medicare Other | Source: Ambulatory Visit

## 2013-10-10 ENCOUNTER — Encounter (INDEPENDENT_AMBULATORY_CARE_PROVIDER_SITE_OTHER): Payer: Self-pay

## 2013-10-10 DIAGNOSIS — Z1231 Encounter for screening mammogram for malignant neoplasm of breast: Secondary | ICD-10-CM

## 2013-10-10 NOTE — Progress Notes (Signed)
  Patient Details  Name: Sonia Oliver MRN: [id number] Date of Birth: 1950/06/08  Today's Date: 10/10/2013 The above patient was discharged from OT on 10/10/2013 secondary to failure to return to clinic since 08/23/2013.  Ailene Ravel, OTR/L,CBIS   10/10/2013, 10:52 AM

## 2013-10-10 NOTE — Addendum Note (Signed)
Encounter addended by: Debby Bud, OT on: 10/10/2013 10:53 AM<BR>     Documentation filed: Episodes, Clinical Notes, Letters

## 2013-11-23 DIAGNOSIS — Z23 Encounter for immunization: Secondary | ICD-10-CM | POA: Diagnosis not present

## 2014-02-10 ENCOUNTER — Emergency Department (HOSPITAL_COMMUNITY)
Admission: EM | Admit: 2014-02-10 | Discharge: 2014-02-10 | Disposition: A | Discharge status: Home / Self Care | Payer: Medicare Other | Attending: Emergency Medicine | Admitting: Emergency Medicine

## 2014-02-10 ENCOUNTER — Encounter (HOSPITAL_COMMUNITY): Payer: Self-pay | Admitting: *Deleted

## 2014-02-10 DIAGNOSIS — H9190 Unspecified hearing loss, unspecified ear: Secondary | ICD-10-CM | POA: Insufficient documentation

## 2014-02-10 DIAGNOSIS — J069 Acute upper respiratory infection, unspecified: Secondary | ICD-10-CM | POA: Diagnosis not present

## 2014-02-10 DIAGNOSIS — Z792 Long term (current) use of antibiotics: Secondary | ICD-10-CM | POA: Insufficient documentation

## 2014-02-10 DIAGNOSIS — Z720 Tobacco use: Secondary | ICD-10-CM | POA: Diagnosis not present

## 2014-02-10 DIAGNOSIS — H9202 Otalgia, left ear: Secondary | ICD-10-CM | POA: Diagnosis present

## 2014-02-10 HISTORY — DX: Unspecified hearing loss, unspecified ear: H91.90

## 2014-02-10 LAB — RAPID STREP SCREEN (MED CTR MEBANE ONLY): Streptococcus, Group A Screen (Direct): NEGATIVE

## 2014-02-10 MED ORDER — SALINE SPRAY 0.65 % NA SOLN
1.0000 | Freq: Once | NASAL | Status: AC
  Administered 2014-02-10: 1 via NASAL
  Filled 2014-02-10: qty 44

## 2014-02-10 NOTE — ED Notes (Signed)
Patient presents with c/o left ear pain that started earlier today.  Patient states that she is deaf in that ear but it feels full.

## 2014-02-10 NOTE — ED Provider Notes (Signed)
CSN: [id number]     Arrival date & time 02/10/14  2144 History  This chart was scribed for non-physician practitioner working with Pamella Pert, MD by Molli Posey, ED Scribe. This patient was seen in room TR09C/TR09C and the patient's care was started at 10:22 PM.    Chief Complaint  Patient presents with  . Otalgia   The history is provided by the patient. No language interpreter was used.   HPI Comments: Sonia Oliver is a 63 y.o. female who presents to the Emergency Department complaining of left ear pain that started earlier today. Patient states that she is deaf in that ear but it feels full and she has some pressure. Pt reports associated subjective fever and chills, sore throat and congestion.She says she started taking Allegra but says it failed to relieve her congestion. She denies nausea and vomiting. Pt reports no modifying factors at this time.    Past Medical History  Diagnosis Date  . Deaf    Past Surgical History  Procedure Laterality Date  . Colon surgery     No family history on file. History  Substance Use Topics  . Smoking status: Current Every Day Smoker  . Smokeless tobacco: Never Used  . Alcohol Use: No   OB History    No data available     Review of Systems  Constitutional: Positive for fever and chills.  HENT: Positive for congestion, ear pain and sore throat.   Gastrointestinal: Negative for nausea and vomiting.  All other systems reviewed and are negative.     Allergies  Codeine and Sulfa antibiotics  Home Medications   Prior to Admission medications   Medication Sig Start Date End Date Taking? Authorizing Provider  azithromycin (ZITHROMAX) 250 MG tablet Take 2 the first day then 1 daily until done 09/12/13   Carole Civil, MD   BP 141/79 mmHg  Pulse 74  Temp(Src) 98.1 F (36.7 C) (Oral)  Resp 16  Ht 5\' 4"  (1.626 m)  Wt 131 lb 3.2 oz (59.512 kg)  BMI 22.51 kg/m2  SpO2 98% Physical Exam  Constitutional: She is oriented  to person, place, and time. She appears well-developed and well-nourished. No distress.  HENT:  Head: Normocephalic and atraumatic.  Right Ear: Tympanic membrane, external ear and ear canal normal.  Left Ear: Tympanic membrane, external ear and ear canal normal.  Nose: Rhinorrhea present.  Mouth/Throat: Oropharynx is clear and moist.  Eyes: Conjunctivae are normal.  Neck: Normal range of motion. Neck supple.  Cardiovascular: Normal rate, regular rhythm and normal heart sounds.   Pulmonary/Chest: Effort normal and breath sounds normal. No respiratory distress. She has no wheezes. She has no rales.  Abdominal: Soft.  Musculoskeletal: Normal range of motion.  Lymphadenopathy:    She has cervical adenopathy.  Neurological: She is alert and oriented to person, place, and time.  Skin: Skin is warm and dry. She is not diaphoretic.  Psychiatric: She has a normal mood and affect.  Nursing note and vitals reviewed.   ED Course  Procedures   Medications  sodium chloride (OCEAN) 0.65 % nasal spray 1 spray (1 spray Each Nare Given 02/10/14 2301)    DIAGNOSTIC STUDIES: Oxygen Saturation is 98% on RA, normal by my interpretation.    COORDINATION OF CARE: 10:25 PM Discussed treatment plan with pt at bedside and pt agreed to plan.   Labs Review Labs Reviewed  RAPID STREP SCREEN  CULTURE, GROUP A STREP    Imaging Review No results found.  EKG Interpretation None      MDM   Final diagnoses:  URI (upper respiratory infection)   Filed Vitals:   02/10/14 2152  BP: 141/79  Pulse: 74  Temp: 98.1 F (36.7 C)  Resp: 16   Afebrile, NAD, non-toxic appearing, AAOx4.  Patients symptoms are consistent with URI, likely viral etiology. Ear fullness likely related to nasal congestion. No evidence of otitis media or externa. No cerumen impaction. Discussed that antibiotics are not indicated for viral infections. Pt will be discharged with symptomatic treatment.  Verbalizes understanding  and is agreeable with plan. Pt is hemodynamically stable & in NAD prior to dc.   I personally performed the services described in this documentation, which was scribed in my presence. The recorded information has been reviewed and is accurate.       Harlow Mares, PA-C 02/10/14 2327  Pamella Pert, MD 02/11/14 [phone]

## 2014-02-10 NOTE — Discharge Instructions (Signed)
Please follow up with your primary care physician in 1-2 days. If you do not have one please call the [facility] number listed above. Please alternate between Motrin and Tylenol every three hours for fevers and pain. Please use nasal spray as prescribed. Please read all discharge instructions and return precautions.    Upper Respiratory Infection, Adult An upper respiratory infection (URI) is also sometimes known as the common cold. The upper respiratory tract includes the nose, sinuses, throat, trachea, and bronchi. Bronchi are the airways leading to the lungs. Most people improve within 1 week, but symptoms can last up to 2 weeks. A residual cough may last even longer.  CAUSES Many different viruses can infect the tissues lining the upper respiratory tract. The tissues become irritated and inflamed and often become very moist. Mucus production is also common. A cold is contagious. You can easily spread the virus to others by oral contact. This includes kissing, sharing a glass, coughing, or sneezing. Touching your mouth or nose and then touching a surface, which is then touched by another person, can also spread the virus. SYMPTOMS  Symptoms typically develop 1 to 3 days after you come in contact with a cold virus. Symptoms vary from person to person. They may include:  Runny nose.  Sneezing.  Nasal congestion.  Sinus irritation.  Sore throat.  Loss of voice (laryngitis).  Cough.  Fatigue.  Muscle aches.  Loss of appetite.  Headache.  Low-grade fever. DIAGNOSIS  You might diagnose your own cold based on familiar symptoms, since most people get a cold 2 to 3 times a year. Your caregiver can confirm this based on your exam. Most importantly, your caregiver can check that your symptoms are not due to another disease such as strep throat, sinusitis, pneumonia, asthma, or epiglottitis. Blood tests, throat tests, and X-rays are not necessary to diagnose a common  cold, but they may sometimes be helpful in excluding other more serious diseases. Your caregiver will decide if any further tests are required. RISKS AND COMPLICATIONS  You may be at risk for a more severe case of the common cold if you smoke cigarettes, have chronic heart disease (such as heart failure) or lung disease (such as asthma), or if you have a weakened immune system. The very young and very old are also at risk for more serious infections. Bacterial sinusitis, middle ear infections, and bacterial pneumonia can complicate the common cold. The common cold can worsen asthma and chronic obstructive pulmonary disease (COPD). Sometimes, these complications can require emergency medical care and may be life-threatening. PREVENTION  The best way to protect against getting a cold is to practice good hygiene. Avoid oral or hand contact with people with cold symptoms. Wash your hands often if contact occurs. There is no clear evidence that vitamin C, vitamin E, echinacea, or exercise reduces the chance of developing a cold. However, it is always recommended to get plenty of rest and practice good nutrition. TREATMENT  Treatment is directed at relieving symptoms. There is no cure. Antibiotics are not effective, because the infection is caused by a virus, not by bacteria. Treatment may include:  Increased fluid intake. Sports drinks offer valuable electrolytes, sugars, and fluids.  Breathing heated mist or steam (vaporizer or shower).  Eating chicken soup or other clear broths, and maintaining good nutrition.  Getting plenty of rest.  Using gargles or lozenges for comfort.  Controlling fevers with ibuprofen or acetaminophen as directed by your caregiver.  Increasing usage  of your inhaler if you have asthma. Zinc gel and zinc lozenges, taken in the first 24 hours of the common cold, can shorten the duration and lessen the severity of symptoms. Pain medicines may help with fever, muscle aches, and  throat pain. A variety of non-prescription medicines are available to treat congestion and runny nose. Your caregiver can make recommendations and may suggest nasal or lung inhalers for other symptoms.  HOME CARE INSTRUCTIONS   Only take over-the-counter or prescription medicines for pain, discomfort, or fever as directed by your caregiver.  Use a warm mist humidifier or inhale steam from a shower to increase air moisture. This may keep secretions moist and make it easier to breathe.  Drink enough water and fluids to keep your urine clear or pale yellow.  Rest as needed.  Return to work when your temperature has returned to normal or as your caregiver advises. You may need to stay home longer to avoid infecting others. You can also use a face mask and careful hand washing to prevent spread of the virus. SEEK MEDICAL CARE IF:   After the first few days, you feel you are getting worse rather than better.  You need your caregiver's advice about medicines to control symptoms.  You develop chills, worsening shortness of breath, or brown or red sputum. These may be signs of pneumonia.  You develop yellow or brown nasal discharge or pain in the face, especially when you bend forward. These may be signs of sinusitis.  You develop a fever, swollen neck glands, pain with swallowing, or white areas in the back of your throat. These may be signs of strep throat. SEEK IMMEDIATE MEDICAL CARE IF:   You have a fever.  You develop severe or persistent headache, ear pain, sinus pain, or chest pain.  You develop wheezing, a prolonged cough, cough up blood, or have a change in your usual mucus (if you have chronic lung disease).  You develop sore muscles or a stiff neck. Document Released: 08/05/2000 Document Revised: 05/04/2011 Document Reviewed: 05/17/2013 [facility] Patient Information 2015 [place], [place]. This information is not intended to replace advice given to you by your health care provider.  Make sure you discuss any questions you have with your health care provider.

## 2014-02-12 DIAGNOSIS — J302 Other seasonal allergic rhinitis: Secondary | ICD-10-CM | POA: Diagnosis not present

## 2014-02-12 DIAGNOSIS — C189 Malignant neoplasm of colon, unspecified: Secondary | ICD-10-CM | POA: Diagnosis not present

## 2014-02-12 DIAGNOSIS — H6692 Otitis media, unspecified, left ear: Secondary | ICD-10-CM | POA: Diagnosis not present

## 2014-02-12 DIAGNOSIS — H60502 Unspecified acute noninfective otitis externa, left ear: Secondary | ICD-10-CM | POA: Diagnosis not present

## 2014-02-12 DIAGNOSIS — Z6822 Body mass index (BMI) 22.0-22.9, adult: Secondary | ICD-10-CM | POA: Diagnosis not present

## 2014-02-13 LAB — CULTURE, GROUP A STREP

## 2014-04-18 DIAGNOSIS — F419 Anxiety disorder, unspecified: Secondary | ICD-10-CM | POA: Diagnosis not present

## 2014-04-18 DIAGNOSIS — E782 Mixed hyperlipidemia: Secondary | ICD-10-CM | POA: Diagnosis not present

## 2014-04-18 DIAGNOSIS — R739 Hyperglycemia, unspecified: Secondary | ICD-10-CM | POA: Diagnosis not present

## 2014-04-18 DIAGNOSIS — Z6822 Body mass index (BMI) 22.0-22.9, adult: Secondary | ICD-10-CM | POA: Diagnosis not present

## 2014-04-19 ENCOUNTER — Emergency Department (HOSPITAL_COMMUNITY): Payer: Medicare Other

## 2014-04-19 ENCOUNTER — Inpatient Hospital Stay (HOSPITAL_COMMUNITY)
Admission: EM | Admit: 2014-04-19 | Discharge: 2014-04-21 | DRG: 066 | Disposition: A | Discharge status: Home / Self Care | Payer: Medicare Other | Attending: Family Medicine | Admitting: Family Medicine

## 2014-04-19 ENCOUNTER — Inpatient Hospital Stay (HOSPITAL_COMMUNITY): Payer: Medicare Other

## 2014-04-19 DIAGNOSIS — Z7982 Long term (current) use of aspirin: Secondary | ICD-10-CM

## 2014-04-19 DIAGNOSIS — I6503 Occlusion and stenosis of bilateral vertebral arteries: Secondary | ICD-10-CM | POA: Diagnosis not present

## 2014-04-19 DIAGNOSIS — R05 Cough: Secondary | ICD-10-CM | POA: Diagnosis not present

## 2014-04-19 DIAGNOSIS — S0990XA Unspecified injury of head, initial encounter: Secondary | ICD-10-CM | POA: Diagnosis not present

## 2014-04-19 DIAGNOSIS — I959 Hypotension, unspecified: Secondary | ICD-10-CM | POA: Diagnosis present

## 2014-04-19 DIAGNOSIS — Z85038 Personal history of other malignant neoplasm of large intestine: Secondary | ICD-10-CM

## 2014-04-19 DIAGNOSIS — R4781 Slurred speech: Secondary | ICD-10-CM | POA: Diagnosis not present

## 2014-04-19 DIAGNOSIS — Z79899 Other long term (current) drug therapy: Secondary | ICD-10-CM

## 2014-04-19 DIAGNOSIS — F32A Depression, unspecified: Secondary | ICD-10-CM | POA: Diagnosis present

## 2014-04-19 DIAGNOSIS — E785 Hyperlipidemia, unspecified: Secondary | ICD-10-CM | POA: Diagnosis present

## 2014-04-19 DIAGNOSIS — F419 Anxiety disorder, unspecified: Secondary | ICD-10-CM

## 2014-04-19 DIAGNOSIS — I952 Hypotension due to drugs: Secondary | ICD-10-CM | POA: Diagnosis not present

## 2014-04-19 DIAGNOSIS — F4323 Adjustment disorder with mixed anxiety and depressed mood: Secondary | ICD-10-CM | POA: Diagnosis present

## 2014-04-19 DIAGNOSIS — I771 Stricture of artery: Secondary | ICD-10-CM

## 2014-04-19 DIAGNOSIS — I6789 Other cerebrovascular disease: Secondary | ICD-10-CM | POA: Diagnosis not present

## 2014-04-19 DIAGNOSIS — R471 Dysarthria and anarthria: Secondary | ICD-10-CM

## 2014-04-19 DIAGNOSIS — I639 Cerebral infarction, unspecified: Secondary | ICD-10-CM | POA: Diagnosis not present

## 2014-04-19 DIAGNOSIS — F17210 Nicotine dependence, cigarettes, uncomplicated: Secondary | ICD-10-CM | POA: Diagnosis present

## 2014-04-19 DIAGNOSIS — J189 Pneumonia, unspecified organism: Secondary | ICD-10-CM

## 2014-04-19 DIAGNOSIS — I6340 Cerebral infarction due to embolism of unspecified cerebral artery: Principal | ICD-10-CM | POA: Diagnosis present

## 2014-04-19 DIAGNOSIS — R4701 Aphasia: Secondary | ICD-10-CM | POA: Diagnosis present

## 2014-04-19 DIAGNOSIS — F329 Major depressive disorder, single episode, unspecified: Secondary | ICD-10-CM | POA: Diagnosis present

## 2014-04-19 DIAGNOSIS — R531 Weakness: Secondary | ICD-10-CM | POA: Diagnosis not present

## 2014-04-19 DIAGNOSIS — R51 Headache: Secondary | ICD-10-CM | POA: Diagnosis not present

## 2014-04-19 HISTORY — DX: Cerebral infarction, unspecified: I63.9

## 2014-04-19 LAB — RAPID URINE DRUG SCREEN, HOSP PERFORMED
AMPHETAMINES: NOT DETECTED
Barbiturates: NOT DETECTED
Benzodiazepines: NOT DETECTED
Cocaine: NOT DETECTED
Opiates: NOT DETECTED
TETRAHYDROCANNABINOL: NOT DETECTED

## 2014-04-19 LAB — URINALYSIS, ROUTINE W REFLEX MICROSCOPIC
Bilirubin Urine: NEGATIVE
Glucose, UA: NEGATIVE mg/dL
Ketones, ur: NEGATIVE mg/dL
LEUKOCYTES UA: NEGATIVE
NITRITE: NEGATIVE
PH: 7.5 (ref 5.0–8.0)
Protein, ur: NEGATIVE mg/dL
SPECIFIC GRAVITY, URINE: 1.005 (ref 1.005–1.030)
Urobilinogen, UA: 0.2 mg/dL (ref 0.0–1.0)

## 2014-04-19 LAB — COMPREHENSIVE METABOLIC PANEL
ALBUMIN: 4.3 g/dL (ref 3.5–5.2)
ALT: 8 U/L (ref 0–35)
ANION GAP: 13 (ref 5–15)
AST: 15 U/L (ref 0–37)
Alkaline Phosphatase: 78 U/L (ref 39–117)
BILIRUBIN TOTAL: 0.2 mg/dL — AB (ref 0.3–1.2)
BUN: 14 mg/dL (ref 6–23)
CO2: 22 mmol/L (ref 19–32)
CREATININE: 0.91 mg/dL (ref 0.50–1.10)
Calcium: 9.7 mg/dL (ref 8.4–10.5)
Chloride: 105 mmol/L (ref 96–112)
GFR calc Af Amer: 76 mL/min — ABNORMAL LOW (ref >90)
GFR calc non Af Amer: 65 mL/min — ABNORMAL LOW (ref >90)
Glucose, Bld: 106 mg/dL — ABNORMAL HIGH (ref 70–99)
Potassium: 4 mmol/L (ref 3.5–5.1)
Sodium: 140 mmol/L (ref 135–145)
TOTAL PROTEIN: 7.1 g/dL (ref 6.0–8.3)

## 2014-04-19 LAB — I-STAT TROPONIN, ED: Troponin i, poc: 0 ng/mL (ref 0.00–0.08)

## 2014-04-19 LAB — CBC
HEMATOCRIT: 41.9 % (ref 36.0–46.0)
Hemoglobin: 14 g/dL (ref 12.0–15.0)
MCH: 29.7 pg (ref 26.0–34.0)
MCHC: 33.4 g/dL (ref 30.0–36.0)
MCV: 88.8 fL (ref 78.0–100.0)
PLATELETS: 271 10*3/uL (ref 150–400)
RBC: 4.72 MIL/uL (ref 3.87–5.11)
RDW: 13.8 % (ref 11.5–15.5)
WBC: 8.4 10*3/uL (ref 4.0–10.5)

## 2014-04-19 LAB — DIFFERENTIAL
Basophils Absolute: 0.1 10*3/uL (ref 0.0–0.1)
Basophils Relative: 1 % (ref 0–1)
EOS ABS: 0.1 10*3/uL (ref 0.0–0.7)
Eosinophils Relative: 1 % (ref 0–5)
Lymphocytes Relative: 35 % (ref 12–46)
Lymphs Abs: 2.9 10*3/uL (ref 0.7–4.0)
MONOS PCT: 5 % (ref 3–12)
Monocytes Absolute: 0.5 10*3/uL (ref 0.1–1.0)
NEUTROS PCT: 58 % (ref 43–77)
Neutro Abs: 5 10*3/uL (ref 1.7–7.7)

## 2014-04-19 LAB — I-STAT CHEM 8, ED
BUN: 16 mg/dL (ref 6–23)
CREATININE: 0.9 mg/dL (ref 0.50–1.10)
Calcium, Ion: 1.1 mmol/L — ABNORMAL LOW (ref 1.13–1.30)
Chloride: 105 mmol/L (ref 96–112)
Glucose, Bld: 105 mg/dL — ABNORMAL HIGH (ref 70–99)
HCT: 46 % (ref 36.0–46.0)
HEMOGLOBIN: 15.6 g/dL — AB (ref 12.0–15.0)
POTASSIUM: 3.9 mmol/L (ref 3.5–5.1)
SODIUM: 141 mmol/L (ref 135–145)
TCO2: 21 mmol/L (ref 0–100)

## 2014-04-19 LAB — URINE MICROSCOPIC-ADD ON

## 2014-04-19 LAB — ETHANOL

## 2014-04-19 LAB — PROTIME-INR
INR: 0.89 (ref 0.00–1.49)
PROTHROMBIN TIME: 12.1 s (ref 11.6–15.2)

## 2014-04-19 LAB — APTT: APTT: 34 s (ref 24–37)

## 2014-04-19 MED ORDER — ACETAMINOPHEN 325 MG PO TABS
650.0000 mg | ORAL_TABLET | ORAL | Status: DC | PRN
  Administered 2014-04-20: 650 mg via ORAL
  Filled 2014-04-19: qty 2

## 2014-04-19 MED ORDER — ASPIRIN 325 MG PO TABS
325.0000 mg | ORAL_TABLET | Freq: Every day | ORAL | Status: DC
  Filled 2014-04-19: qty 1

## 2014-04-19 MED ORDER — ASPIRIN EC 81 MG PO TBEC: 81.0000 mg | DELAYED_RELEASE_TABLET | Freq: Every day | ORAL | Status: DC

## 2014-04-19 MED ORDER — STROKE: EARLY STAGES OF RECOVERY BOOK
Freq: Once | Status: AC
  Administered 2014-04-20: 01:00:00
  Filled 2014-04-19: qty 1

## 2014-04-19 MED ORDER — SODIUM CHLORIDE 0.9 % IV SOLN
INTRAVENOUS | Status: DC
  Administered 2014-04-20 – 2014-04-21 (×3): via INTRAVENOUS

## 2014-04-19 MED ORDER — SENNOSIDES-DOCUSATE SODIUM 8.6-50 MG PO TABS
1.0000 | ORAL_TABLET | Freq: Every evening | ORAL | Status: DC | PRN
  Filled 2014-04-19: qty 1

## 2014-04-19 MED ORDER — LORAZEPAM 2 MG/ML IJ SOLN
1.0000 mg | Freq: Once | INTRAMUSCULAR | Status: AC
  Administered 2014-04-19: 1 mg via INTRAVENOUS
  Filled 2014-04-19: qty 1

## 2014-04-19 MED ORDER — ASPIRIN 300 MG RE SUPP: 300.0000 mg | Freq: Every day | RECTAL | Status: DC

## 2014-04-19 MED ORDER — ACETAMINOPHEN 650 MG RE SUPP: 650.0000 mg | RECTAL | Status: DC | PRN

## 2014-04-19 NOTE — Consult Note (Addendum)
Referring Physician: ED    Chief Complaint: CODE STROKE, DYSARTHRIA, HA  HPI:                                                                                                                                         Sonia Oliver is an 64 y.o. female without relevant past medical history brought in by husband due to acute onset speech difficulty. Never had similar symptom before. She was last known well last night, and her family said that today around 1 pm they noticed mild slurred speech that went away but came back again approximately at 6 pm. Patient said that she had a fall at 2 or 3 pm and hit her head. In addition, she complains of HA and " a hot sensation" in her face. Michela Pitcher that she is doing her best " to speak normal" but something is afraid that something is going wrong. Denies vertigo, double vision, difficulty swallowing, focal weakness, confusion, or visual disturbances. Initial NIHSS 5. CT brain revealed reviewed by myself showed no acute abnormality.  On daily aspirin.  Date last known well: 04/18/14 Time last known well: 10 pm tPA Given: no, out of the window NIHSS: 5   No past medical history on file.  No past surgical history on file.  No family history on file. Social History:  has no tobacco, alcohol, and drug history on file.  Allergies: Not on File  Medications:                                                                                                                           I have reviewed the patient's current medications.  ROS:  History obtained from the patient, family, and chart review  General ROS: negative for - chills, fatigue, fever, night sweats, weight gain or weight loss Psychological ROS: negative for - behavioral disorder, hallucinations, memory difficulties, mood swings or suicidal ideation Ophthalmic ROS:  negative for - blurry vision, double vision, eye pain or loss of vision ENT ROS: negative for - epistaxis, nasal discharge, oral lesions, sore throat, tinnitus or vertigo Allergy and Immunology ROS: negative for - hives or itchy/watery eyes Hematological and Lymphatic ROS: negative for - bleeding problems, bruising or swollen lymph nodes Endocrine ROS: negative for - galactorrhea, hair pattern changes, polydipsia/polyuria or temperature intolerance Respiratory ROS: negative for - cough, hemoptysis, shortness of breath or wheezing Cardiovascular ROS: negative for - chest pain, dyspnea on exertion, edema or irregular heartbeat Gastrointestinal ROS: negative for - abdominal pain, diarrhea, hematemesis, nausea/vomiting or stool incontinence Genito-Urinary ROS: negative for - dysuria, hematuria, incontinence or urinary frequency/urgency Musculoskeletal ROS: negative for - joint swelling or muscular weakness Neurological ROS: as noted in HPI Dermatological ROS: negative for rash and skin lesion changes    Physical exam: pleasant female in no apparent distress. Blood pressure 146/80, pulse 58, resp. rate 16, SpO2 99 %. Head: normocephalic. Neck: supple, no bruits, no JVD. Cardiac: no murmurs. Lungs: clear. Abdomen: soft, no tender, no mass. Extremities: no edema. Skin: no rash Neurologic Examination:                                                                                                      General: Mental Status: Alert, oriented, thought content appropriate.  Speech mildly dysarthric without evidence of aphasia.  Able to follow 3 step commands without difficulty. Cranial Nerves: II: Discs flat bilaterally; Visual fields grossly normal, pupils equal, round, reactive to light and accommodation III,IV, VI: ptosis not present, extra-ocular motions intact bilaterally V,VII: smile symmetric, facial light touch sensation normal bilaterally VIII: hearing normal bilaterally IX,X: gag reflex  present XI: bilateral shoulder shrug XII: midline tongue extension without atrophy or fasciculations Motor: Mild left sided weakness Tone and bulk:normal tone throughout; no atrophy noted Sensory: Pinprick and light touch slightly decreased in the left side Deep Tendon Reflexes:  Right: Upper Extremity   Left: Upper extremity   biceps (C-5 to C-6) 2/4   biceps (C-5 to C-6) 2/4 tricep (C7) 2/4    triceps (C7) 2/4 Brachioradialis (C6) 2/4  Brachioradialis (C6) 2/4  Lower Extremity Lower Extremity  quadriceps (L-2 to L-4) 2/4   quadriceps (L-2 to L-4) 2/4 Achilles (S1) 2/4   Achilles (S1) 2/4 Plantars:  Right: downgoing   Left: downgoing Cerebellar: finger-to-nose impaired in the left ,  normal heel-to-shin test Gait:  No tested for safety reasons    Results for orders placed or performed during the hospital encounter of 04/19/14 (from the past 48 hour(s))  Protime-INR     Status: None   Collection Time: 04/19/14  7:30 PM  Result Value Ref Range   Prothrombin Time 12.1 11.6 - 15.2 seconds   INR 0.89 0.00 - 1.49  APTT     Status: None  Collection Time: 04/19/14  7:30 PM  Result Value Ref Range   aPTT 34 24 - 37 seconds  CBC     Status: None   Collection Time: 04/19/14  7:30 PM  Result Value Ref Range   WBC 8.4 4.0 - 10.5 K/uL   RBC 4.72 3.87 - 5.11 MIL/uL   Hemoglobin 14.0 12.0 - 15.0 g/dL   HCT 41.9 36.0 - 46.0 %   MCV 88.8 78.0 - 100.0 fL   MCH 29.7 26.0 - 34.0 pg   MCHC 33.4 30.0 - 36.0 g/dL   RDW 13.8 11.5 - 15.5 %   Platelets 271 150 - 400 K/uL  Differential     Status: None   Collection Time: 04/19/14  7:30 PM  Result Value Ref Range   Neutrophils Relative % 58 43 - 77 %   Neutro Abs 5.0 1.7 - 7.7 K/uL   Lymphocytes Relative 35 12 - 46 %   Lymphs Abs 2.9 0.7 - 4.0 K/uL   Monocytes Relative 5 3 - 12 %   Monocytes Absolute 0.5 0.1 - 1.0 K/uL   Eosinophils Relative 1 0 - 5 %   Eosinophils Absolute 0.1 0.0 - 0.7 K/uL   Basophils Relative 1 0 - 1 %    Basophils Absolute 0.1 0.0 - 0.1 K/uL  Comprehensive metabolic panel     Status: Abnormal   Collection Time: 04/19/14  7:30 PM  Result Value Ref Range   Sodium 140 135 - 145 mmol/L   Potassium 4.0 3.5 - 5.1 mmol/L   Chloride 105 96 - 112 mmol/L   CO2 22 19 - 32 mmol/L   Glucose, Bld 106 (H) 70 - 99 mg/dL   BUN 14 6 - 23 mg/dL   Creatinine, Ser 0.91 0.50 - 1.10 mg/dL   Calcium 9.7 8.4 - 10.5 mg/dL   Total Protein 7.1 6.0 - 8.3 g/dL   Albumin 4.3 3.5 - 5.2 g/dL   AST 15 0 - 37 U/L   ALT 8 0 - 35 U/L   Alkaline Phosphatase 78 39 - 117 U/L   Total Bilirubin 0.2 (L) 0.3 - 1.2 mg/dL   GFR calc non Af Amer 65 (L) >90 mL/min   GFR calc Af Amer 76 (L) >90 mL/min    Comment: (NOTE) The eGFR has been calculated using the CKD EPI equation. This calculation has not been validated in all clinical situations. eGFR's persistently <90 mL/min signify possible Chronic Kidney Disease.    Anion gap 13 5 - 15  Ethanol     Status: None   Collection Time: 04/19/14  7:38 PM  Result Value Ref Range   Alcohol, Ethyl (B) <5 0 - 9 mg/dL    Comment:        LOWEST DETECTABLE LIMIT FOR SERUM ALCOHOL IS 11 mg/dL FOR MEDICAL PURPOSES ONLY   I-Stat Troponin, ED (not at [facility])     Status: None   Collection Time: 04/19/14  7:46 PM  Result Value Ref Range   Troponin i, poc 0.00 0.00 - 0.08 ng/mL   Comment 3            Comment: Due to the release kinetics of cTnI, a negative result within the first hours of the onset of symptoms does not rule out myocardial infarction with certainty. If myocardial infarction is still suspected, repeat the test at appropriate intervals.   I-Stat Chem 8, ED     Status: Abnormal   Collection Time: 04/19/14  7:48 PM  Result  Value Ref Range   Sodium 141 135 - 145 mmol/L   Potassium 3.9 3.5 - 5.1 mmol/L   Chloride 105 96 - 112 mmol/L   BUN 16 6 - 23 mg/dL   Creatinine, Ser 0.90 0.50 - 1.10 mg/dL   Glucose, Bld 105 (H) 70 - 99 mg/dL   Calcium, Ion 1.10 (L) 1.13 - 1.30  mmol/L   TCO2 21 0 - 100 mmol/L   Hemoglobin 15.6 (H) 12.0 - 15.0 g/dL   HCT 46.0 36.0 - 46.0 %   Ct Head Wo Contrast  04/19/2014   CLINICAL DATA:  Code stroke. Last seen normal at 1700 hr. Slurred speech and frontal headache.  EXAM: CT HEAD WITHOUT CONTRAST  TECHNIQUE: Contiguous axial images were obtained from the base of the skull through the vertex without intravenous contrast.  COMPARISON:  None.  FINDINGS: Ventricles and sulci appear symmetrical. No significant ventricular dilatation. No evidence of significant white matter disease. No mass effect or midline shift. No abnormal extra-axial fluid collections. Gray-white matter junctions are distinct. Basal cisterns are not effaced. No evidence of acute intracranial hemorrhage. No depressed skull fractures. Opacification of the right sphenoid sinus with mucosal thickening in some of the remaining paranasal sinuses. Mastoid air cells are not opacified. Vascular calcifications are present.  IMPRESSION: No acute intracranial abnormality. Opacification of the right sphenoid sinus.  These results were called by telephone at the time of interpretation on 04/19/2014 at 7:56 pm to Dr. Aram Beecham, who verbally acknowledged these results.   Electronically Signed   By: Lucienne Capers M.D.   On: 04/19/2014 19:58     Assessment: 64 y.o. female with acute onset dysarthria, HA, and mild left sided weakness. NIHSS 5. CT brain without acute abnormality. Concern for right brain infarct. Patient is out of the window for thrombolysis. Admit to medicine. Stroke work up. Aspirin after passing swallowing evaluation. Stroke team will follow up tomorrow.  Stroke Risk Factors - unknown  Plan: 1. HgbA1c, fasting lipid panel 2. MRI, MRA  of the brain without contrast 3. Echocardiogram 4. Carotid dopplers 5. Prophylactic therapy-aspirin after passing swallowing eval 6. Risk factor modification 7. Telemetry monitoring 8. Frequent neuro checks 9. PT/OT SLP  Dorian Pod, MD [facility] [phone]  04/19/2014, 9:08 PM

## 2014-04-19 NOTE — Progress Notes (Signed)
Code Stroke called on 64 y.o female.LSN 2200 2/24 when spouse went to bed. Family spoke with her on telephone at 1pm and noticed slurred speech. This resolved while on the phone and returned around 6 pm this afternoon. Pt also states that she had a fall today around 2-3 pm but did not hit her head or loose consciousness. There is no history on file and patient/family deny history or meds.  Pt taken to CT scan stat. NIHSS completed yielding 5. See neuro flow sheet for specifics. Pt not a TPA candidate at this time due to being out of the window. Pt for admit tonight for stroke work up. MRI pending for tonight.

## 2014-04-19 NOTE — ED Notes (Signed)
Pt presenting with slurred speech.  Per pt's husband, pt LSN at 1700.  Code stroke called in triage.  Pt intermittently reporting a headache with RN and that her face is hot.

## 2014-04-19 NOTE — ED Provider Notes (Signed)
CSN: [id number]     Arrival date & time 04/19/14  1927 History   First MD Initiated Contact with Patient 04/19/14 1948     Chief Complaint  Patient presents with  . Code Stroke     (Consider location/radiation/quality/duration/timing/severity/associated sxs/prior Treatment) Patient is a 64 y.o. female presenting with neurologic complaint. The history is provided by the patient.  Neurologic Problem This is a new problem. The current episode started 3 to 5 hours ago. The problem occurs constantly. The problem has not changed since onset.Pertinent negatives include no chest pain, no abdominal pain and no shortness of breath. Nothing aggravates the symptoms. Nothing relieves the symptoms. She has tried nothing for the symptoms.    No past medical history on file. No past surgical history on file. No family history on file. History  Substance Use Topics  . Smoking status: Not on file  . Smokeless tobacco: Not on file  . Alcohol Use: Not on file   OB History    No data available     Review of Systems  Constitutional: Negative for fever and chills.  Respiratory: Negative for cough and shortness of breath.   Cardiovascular: Negative for chest pain.  Gastrointestinal: Negative for abdominal pain.  All other systems reviewed and are negative.     Allergies  Review of patient's allergies indicates not on file.  Home Medications   Prior to Admission medications   Not on File   There were no vitals taken for this visit. Physical Exam  Constitutional: She is oriented to person, place, and time. She appears well-developed and well-nourished. No distress.  HENT:  Head: Normocephalic and atraumatic.  Mouth/Throat: Oropharynx is clear and moist.  Eyes: EOM are normal. Pupils are equal, round, and reactive to light.  Neck: Normal range of motion. Neck supple.  Cardiovascular: Normal rate and regular rhythm.  Exam reveals no friction rub.   No murmur heard. Pulmonary/Chest: Effort  normal and breath sounds normal. No respiratory distress. She has no wheezes. She has no rales.  Abdominal: Soft. She exhibits no distension. There is no tenderness. There is no rebound.  Musculoskeletal: Normal range of motion. She exhibits no edema.  Neurological: She is alert and oriented to person, place, and time. No cranial nerve deficit. She exhibits normal muscle tone.  Aphasia  Skin: She is not diaphoretic.  Nursing note and vitals reviewed.   ED Course  Procedures (including critical care time) Labs Review Labs Reviewed  I-STAT CHEM 8, ED - Abnormal; Notable for the following:    Glucose, Bld 105 (*)    Calcium, Ion 1.10 (*)    Hemoglobin 15.6 (*)    All other components within normal limits  CBC  DIFFERENTIAL  ETHANOL  PROTIME-INR  APTT  COMPREHENSIVE METABOLIC PANEL  URINE RAPID DRUG SCREEN (HOSP PERFORMED)  URINALYSIS, ROUTINE W REFLEX MICROSCOPIC  I-STAT TROPOININ, ED  I-STAT TROPOININ, ED    Imaging Review Ct Head Wo Contrast  04/19/2014   CLINICAL DATA:  Code stroke. Last seen normal at 1700 hr. Slurred speech and frontal headache.  EXAM: CT HEAD WITHOUT CONTRAST  TECHNIQUE: Contiguous axial images were obtained from the base of the skull through the vertex without intravenous contrast.  COMPARISON:  None.  FINDINGS: Ventricles and sulci appear symmetrical. No significant ventricular dilatation. No evidence of significant white matter disease. No mass effect or midline shift. No abnormal extra-axial fluid collections. Gray-white matter junctions are distinct. Basal cisterns are not effaced. No evidence of acute intracranial hemorrhage.  No depressed skull fractures. Opacification of the right sphenoid sinus with mucosal thickening in some of the remaining paranasal sinuses. Mastoid air cells are not opacified. Vascular calcifications are present.  IMPRESSION: No acute intracranial abnormality. Opacification of the right sphenoid sinus.  These results were called by  telephone at the time of interpretation on 04/19/2014 at 7:56 pm to Dr. Aram Beecham, who verbally acknowledged these results.   Electronically Signed   By: Lucienne Capers M.D.   On: 04/19/2014 19:58     EKG Interpretation   Date/Time:  Thursday April 19 2014 20:07:31 EST Ventricular Rate:  53 PR Interval:  159 QRS Duration: 83 QT Interval:  425 QTC Calculation: 399 R Axis:   64 Text Interpretation:  Sinus rhythm Atrial premature complex No prior for  comparison Confirmed by Mingo Amber  MD, Tesean Stump ([id number]) on 04/19/2014 8:16:58 PM      MDM   Final diagnoses:  Stroke    64 year old female presented with slurred speech. Last seen normal between 5:30 and 6 PM. Could show initiated in triage. Airways patent we'll had a head CT. Head CT is normal. Neurology at bedside for further evaluation. Is aphasic. Having some R leg weakness and altered light touch. R arm with mild altered light touch. Also some mild L arm weakness. Rapid response Stroke Nurse got NIH scale of 5. Dr. Armida Sans spoke with patient, has had symptoms intermittently since last night, so true last known normal was last night, making her not a candidate for tPA. Admitted.  Evelina Bucy, MD 04/19/14 2108

## 2014-04-19 NOTE — H&P (Signed)
[facility] History and Physical  Zeyna Mkrtchyan HER:[id number] DOB: 1950-03-19 DOA: 04/19/2014  Referring physician: Evelina Bucy, MD PCP: Purvis Kilts, MD   Chief Complaint: Acute Stroke  HPI: Sonia Oliver is a 64 y.o. female presents with an acute stroke. She state that she has felt weak all day long. She states after 2PM she suffered a fall. She states that she was in the bedroom at the time. She was on the bed at the time. She has had some speech difficulty with findings words. She is oriented to place though. She states that after this event she actually took a shower and walked to the car to come to the ER. The speech problem started around 1PM and resolved and then came back around 5PM. She states that she is trying to say the words but they are not coming out clear. She had a headache earlier. She states that this was around the time when her speech got bad. She states does not have a history of High blood pressure. She does have a history of colon cancer and was resected in 2011. Patient does smoke. She does not drink.    Review of Systems:  Constitutional:  No weight loss, night sweats, Fevers, chills, fatigue.  HEENT:  ++ headaches,No sneezing, itching, ear ache, nasal congestion, post nasal drip,  Cardio-vascular:  No chest pain, Orthopnea, PND, swelling in lower extremities, anasarca, dizziness  GI:  No heartburn, indigestion, abdominal pain, nausea, vomiting, diarrhea  Resp:  No shortness of breath with exertion or at rest. No coughing up of blood.No wheezing  Skin:  no rash or lesions.  GU:  no dysuria, change in color of urine, no urgency or frequency Musculoskeletal:  No joint pain or swelling. No decreased range of motion. No back pain.  Psych:  No change in mood or affect. No depression or anxiety. No memory loss.   No past medical history on file. No past surgical history on file. Social History:  has no tobacco, alcohol, and drug history on  file.  Not on File  No family history on file.   Prior to Admission medications   Not on File   Physical Exam: Filed Vitals:   04/19/14 2000 04/19/14 2012 04/19/14 2030  BP: 171/83  146/80  Pulse: 67  58  Resp: 22  16  SpO2: 100% 98% 99%    Wt Readings from Last 3 Encounters:  No data found for Wt    General:  Appears calm and comfortable Eyes: PERRL, normal lids, irises & conjunctiva ENT: grossly normal hearing, lips & tongue Neck: no LAD, masses or thyromegaly Cardiovascular: RRR, no m/r/g. No LE edema. Respiratory: CTA bilaterally, no w/r/r. Normal respiratory effort. Abdomen: soft, ntnd Skin: no rash or induration seen on limited exam Musculoskeletal: grossly normal tone BUE/BLE Psychiatric: grossly normal mood and affect, has difficulty finding words Neurologic: grossly non-focal except for her speech          Labs on Admission:  Basic Metabolic Panel:  Recent Labs Lab 04/19/14 1930 04/19/14 1948  NA 140 141  K 4.0 3.9  CL 105 105  CO2 22  --   GLUCOSE 106* 105*  BUN 14 16  CREATININE 0.91 0.90  CALCIUM 9.7  --    Liver Function Tests:  Recent Labs Lab 04/19/14 1930  AST 15  ALT 8  ALKPHOS 78  BILITOT 0.2*  PROT 7.1  ALBUMIN 4.3   No results for input(s): LIPASE, AMYLASE in the last 168 hours.  No results for input(s): AMMONIA in the last 168 hours. CBC:  Recent Labs Lab 04/19/14 1930 04/19/14 1948  WBC 8.4  --   NEUTROABS 5.0  --   HGB 14.0 15.6*  HCT 41.9 46.0  MCV 88.8  --   PLT 271  --    Cardiac Enzymes: No results for input(s): CKTOTAL, CKMB, CKMBINDEX, TROPONINI in the last 168 hours.  BNP (last 3 results) No results for input(s): BNP in the last 8760 hours.  ProBNP (last 3 results) No results for input(s): PROBNP in the last 8760 hours.  CBG: No results for input(s): GLUCAP in the last 168 hours.  Radiological Exams on Admission: Ct Head Wo Contrast  04/19/2014   CLINICAL DATA:  Code stroke. Last seen normal at  1700 hr. Slurred speech and frontal headache.  EXAM: CT HEAD WITHOUT CONTRAST  TECHNIQUE: Contiguous axial images were obtained from the base of the skull through the vertex without intravenous contrast.  COMPARISON:  None.  FINDINGS: Ventricles and sulci appear symmetrical. No significant ventricular dilatation. No evidence of significant white matter disease. No mass effect or midline shift. No abnormal extra-axial fluid collections. Gray-white matter junctions are distinct. Basal cisterns are not effaced. No evidence of acute intracranial hemorrhage. No depressed skull fractures. Opacification of the right sphenoid sinus with mucosal thickening in some of the remaining paranasal sinuses. Mastoid air cells are not opacified. Vascular calcifications are present.  IMPRESSION: No acute intracranial abnormality. Opacification of the right sphenoid sinus.  These results were called by telephone at the time of interpretation on 04/19/2014 at 7:56 pm to Dr. Aram Beecham, who verbally acknowledged these results.   Electronically Signed   By: Lucienne Capers M.D.   On: 04/19/2014 19:58      Assessment/Plan Active Problems:   Stroke   Acute embolic stroke   1. Acute Stroke -risk factor smoking -deemed out of tPA window -will get MRI MRA -will get an echo -will get carotid doppler -PT/OT evaluation -ASA started  2. Elevated Blood Pressure -not know to be a hypertensive but her pressure has been borderline elevated -will monitor pressures and treat as needed   Code Status: Full Code (must indicate code status--if unknown or must be presumed, indicate so) DVT Prophylaxis:SCDs Family Communication: Husband (indicate person spoken with, if applicable, with phone number if by telephone) Disposition Plan: Home (indicate anticipated LOS)  Time spent: 69min  Brandace Cargle A [facility] Pager [phone]

## 2014-04-20 ENCOUNTER — Inpatient Hospital Stay (HOSPITAL_COMMUNITY): Payer: Medicare Other

## 2014-04-20 ENCOUNTER — Encounter (HOSPITAL_COMMUNITY): Payer: Self-pay | Admitting: *Deleted

## 2014-04-20 DIAGNOSIS — I6340 Cerebral infarction due to embolism of unspecified cerebral artery: Principal | ICD-10-CM

## 2014-04-20 DIAGNOSIS — R471 Dysarthria and anarthria: Secondary | ICD-10-CM

## 2014-04-20 DIAGNOSIS — I6789 Other cerebrovascular disease: Secondary | ICD-10-CM

## 2014-04-20 DIAGNOSIS — I959 Hypotension, unspecified: Secondary | ICD-10-CM

## 2014-04-20 DIAGNOSIS — I952 Hypotension due to drugs: Secondary | ICD-10-CM

## 2014-04-20 LAB — CBC WITH DIFFERENTIAL/PLATELET
Basophils Absolute: 0 10*3/uL (ref 0.0–0.1)
Basophils Relative: 1 % (ref 0–1)
Eosinophils Absolute: 0.1 10*3/uL (ref 0.0–0.7)
Eosinophils Relative: 2 % (ref 0–5)
HEMATOCRIT: 39.2 % (ref 36.0–46.0)
Hemoglobin: 12.4 g/dL (ref 12.0–15.0)
LYMPHS ABS: 2.2 10*3/uL (ref 0.7–4.0)
Lymphocytes Relative: 32 % (ref 12–46)
MCH: 28.8 pg (ref 26.0–34.0)
MCHC: 31.6 g/dL (ref 30.0–36.0)
MCV: 91.2 fL (ref 78.0–100.0)
MONO ABS: 0.4 10*3/uL (ref 0.1–1.0)
MONOS PCT: 7 % (ref 3–12)
NEUTROS ABS: 4 10*3/uL (ref 1.7–7.7)
Neutrophils Relative %: 59 % (ref 43–77)
Platelets: 230 10*3/uL (ref 150–400)
RBC: 4.3 MIL/uL (ref 3.87–5.11)
RDW: 14.1 % (ref 11.5–15.5)
WBC: 6.8 10*3/uL (ref 4.0–10.5)

## 2014-04-20 LAB — COMPREHENSIVE METABOLIC PANEL
ALK PHOS: 62 U/L (ref 39–117)
ALT: 9 U/L (ref 0–35)
AST: 12 U/L (ref 0–37)
Albumin: 3.2 g/dL — ABNORMAL LOW (ref 3.5–5.2)
Anion gap: 2 — ABNORMAL LOW (ref 5–15)
BILIRUBIN TOTAL: 0.6 mg/dL (ref 0.3–1.2)
BUN: 11 mg/dL (ref 6–23)
CHLORIDE: 114 mmol/L — AB (ref 96–112)
CO2: 27 mmol/L (ref 19–32)
Calcium: 8.2 mg/dL — ABNORMAL LOW (ref 8.4–10.5)
Creatinine, Ser: 0.87 mg/dL (ref 0.50–1.10)
GFR calc Af Amer: 80 mL/min — ABNORMAL LOW (ref >90)
GFR, EST NON AFRICAN AMERICAN: 69 mL/min — AB (ref >90)
Glucose, Bld: 94 mg/dL (ref 70–99)
Potassium: 4.6 mmol/L (ref 3.5–5.1)
Sodium: 143 mmol/L (ref 135–145)
Total Protein: 5.7 g/dL — ABNORMAL LOW (ref 6.0–8.3)

## 2014-04-20 LAB — LIPID PANEL
CHOLESTEROL: 192 mg/dL (ref 0–200)
HDL: 45 mg/dL (ref >39)
LDL CALC: 126 mg/dL — AB (ref 0–99)
TRIGLYCERIDES: 107 mg/dL (ref <150)
Total CHOL/HDL Ratio: 4.3 RATIO
VLDL: 21 mg/dL (ref 0–40)

## 2014-04-20 LAB — URINE MICROSCOPIC-ADD ON

## 2014-04-20 LAB — URINALYSIS, ROUTINE W REFLEX MICROSCOPIC
Bilirubin Urine: NEGATIVE
GLUCOSE, UA: NEGATIVE mg/dL
Ketones, ur: NEGATIVE mg/dL
Leukocytes, UA: NEGATIVE
Nitrite: NEGATIVE
Protein, ur: NEGATIVE mg/dL
Specific Gravity, Urine: 1.012 (ref 1.005–1.030)
Urobilinogen, UA: 0.2 mg/dL (ref 0.0–1.0)
pH: 6 (ref 5.0–8.0)

## 2014-04-20 MED ORDER — ASPIRIN 300 MG RE SUPP
300.0000 mg | Freq: Every day | RECTAL | Status: DC
Start: 2014-04-20 — End: 2014-04-21

## 2014-04-20 MED ORDER — ASPIRIN 325 MG PO TABS
ORAL_TABLET | ORAL | Status: AC
  Administered 2014-04-20: 01:00:00
  Filled 2014-04-20: qty 1

## 2014-04-20 MED ORDER — ENSURE COMPLETE PO LIQD
237.0000 mL | Freq: Two times a day (BID) | ORAL | Status: DC
  Administered 2014-04-20 – 2014-04-21 (×2): 237 mL via ORAL

## 2014-04-20 MED ORDER — SODIUM CHLORIDE 0.9 % IV BOLUS (SEPSIS)
500.0000 mL | Freq: Once | INTRAVENOUS | Status: AC
  Administered 2014-04-20: 500 mL via INTRAVENOUS

## 2014-04-20 MED ORDER — ALPRAZOLAM 0.25 MG PO TABS
0.2500 mg | ORAL_TABLET | Freq: Three times a day (TID) | ORAL | Status: DC
Start: 2014-04-20 — End: 2014-04-20
  Filled 2014-04-20: qty 1

## 2014-04-20 MED ORDER — ATORVASTATIN CALCIUM 10 MG PO TABS
20.0000 mg | ORAL_TABLET | Freq: Every day | ORAL | Status: DC
  Administered 2014-04-20: 20 mg via ORAL
  Filled 2014-04-20: qty 2

## 2014-04-20 MED ORDER — ASPIRIN 325 MG PO TABS
325.0000 mg | ORAL_TABLET | Freq: Every day | ORAL | Status: DC
  Administered 2014-04-20 – 2014-04-21 (×2): 325 mg via ORAL
  Filled 2014-04-20: qty 1

## 2014-04-20 MED ORDER — ALPRAZOLAM 0.25 MG PO TABS: 0.2500 mg | ORAL_TABLET | Freq: Three times a day (TID) | ORAL | Status: DC | PRN

## 2014-04-20 MED ORDER — ASPIRIN 81 MG PO CHEW
CHEWABLE_TABLET | ORAL | Status: AC
  Filled 2014-04-20: qty 1

## 2014-04-20 NOTE — Progress Notes (Signed)
VASCULAR LAB PRELIMINARY  PRELIMINARY  PRELIMINARY  PRELIMINARY  Carotid duplex  completed.    Preliminary report:  Bilateral:  1-39% ICA stenosis.  Right:  Vertebral artery retrograde.  Left: Vertebral artery flow is antegrade.     Safiyah Cisney, RVT 04/20/2014, 2:51 PM

## 2014-04-20 NOTE — Progress Notes (Signed)
  Echocardiogram 2D Echocardiogram has been performed.  Johny Chess 04/20/2014, 2:15 PM

## 2014-04-20 NOTE — Progress Notes (Signed)
Patient's BP 79/54 manually, patient is pale, in no distress. MD paged, order for 500 mL NS bolus placed. Bolus running now. No signs of infiltration. Will recheck BP after bolus. Call bell within patient's reach. Will continue to monitor patient.

## 2014-04-20 NOTE — Progress Notes (Signed)
MD at bedside, spoke with family regarding transfer

## 2014-04-20 NOTE — Evaluation (Signed)
Speech Language Pathology Evaluation Patient Details Name: Sonia Oliver MRN: [id number] DOB: [birth year] Today's Date: 04/20/2014 Time: 4174-0814 SLP Time Calculation (min) (ACUTE ONLY): 21 min  Problem List:  Patient Active Problem List   Diagnosis Date Noted  . Hypotension 04/20/2014  . Dysarthria 04/20/2014  . Stroke 04/19/2014  . Acute embolic stroke 48/18/5631   Past Medical History: History reviewed. No pertinent past medical history. Past Surgical History: History reviewed. No pertinent past surgical history. HPI:  64 y.o. female with acute onset dysarthria, HA, and mild left sided weakness.  CT and MRI  negative.    Assessment / Plan / Recommendation Clinical Impression  Speech-language evaluation completed, revealing normal comprehension and expression.  Speech subsystems characterized by normal respiration, phonation, and resonance.  Articulation is marked by staccato-like utterances with errors noted as blend simplifications, initial phoneme substitutions, anticipatory substitutions.  Errors are generally consistent with increasing length of target, but are more prevalent in sentences with low probability words (e.g., "the spy fled to [place]" pronounced as "the by bed to [place].")  Pt is stimulable for target sounds in isolation.   Per husband, speech is much different than baseline.  Pt does have chronic hearing impairment, but both pt and husband deny impact on speech production. Etiology of speech changes unclear - errors are more consistent with motor programming deficit than a dysarthria.  SLP to follow.     SLP Assessment  Patient needs continued Speech Lanaguage Pathology Services    Follow Up Recommendations   (tba)    Frequency and Duration min 2x/week  1 week   Pertinent Vitals/Pain Pain Assessment: No/denies pain   SLP Goals  Potential to Achieve Goals (ACUTE ONLY): Fair  SLP Evaluation Prior Functioning  Cognitive/Linguistic Baseline: Within functional  limits   Cognition  Overall Cognitive Status: Within Functional Limits for tasks assessed Orientation Level: Oriented X4    Comprehension  Auditory Comprehension Overall Auditory Comprehension: Appears within functional limits for tasks assessed Visual Recognition/Discrimination Discrimination: Within Function Limits Reading Comprehension Reading Status: Within funtional limits    Expression Expression Primary Mode of Expression: Verbal Verbal Expression Overall Verbal Expression: Appears within functional limits for tasks assessed   Oral / Motor Oral Motor/Sensory Function Overall Oral Motor/Sensory Function: Appears within functional limits for tasks assessed Motor Speech Overall Motor Speech: Impaired Respiration: Within functional limits Phonation: Normal Resonance: Within functional limits Articulation: Impaired Level of Impairment: Word Intelligibility: Intelligibility reduced Word: 75-100% accurate Phrase: 75-100% accurate Sentence: 75-100% accurate Conversation: 75-100% accurate Motor Planning: Impaired Level of Impairment: Word Motor Speech Errors: Aware Effective Techniques: Pacing (oral reading)   Vetta Couzens L. Tivis Ringer, [place] CCC/SLP Pager [phone]      Juan Quam Laurice 04/20/2014, 3:46 PM

## 2014-04-20 NOTE — Progress Notes (Signed)
SLP Cancellation Note  Patient Details Name: Sonia Oliver MRN: [id number] DOB: Mar 06, 1950   Cancelled evaluation:       Reason Eval/Treat Not Completed: Patient at procedure or test/unavailable   Juan Quam Laurice 04/20/2014, 10:58 AM

## 2014-04-20 NOTE — Progress Notes (Signed)
Patient's BP 86/47 post fluid bolus. Patient is oriented but drowsy. MD aware, new orders placed. Call bell within patient's reach, will continue to monitor patient closely.

## 2014-04-20 NOTE — Progress Notes (Signed)
CARE MANAGEMENT NOTE 04/20/2014  Patient:  Sonia Oliver, Sonia Oliver   Account Number:  [id number]  Date Initiated:  04/20/2014  Documentation initiated by:  Olga Coaster  Subjective/Objective Assessment:   ADMITTED WITH STROKE     Action/Plan:   CM FOLLOWING FOR DCP   Anticipated DC Date:  04/24/2014   Anticipated DC Plan:  AWAITING FOR PT/OT EVALS FOR DISPOSITION NEEDS     DC Planning Services  CM consult         Status of service:  In process, will continue to follow  Per UR Regulation:  Reviewed for med. necessity/level of care/duration of stay  Comments:  2/26/2016Mindi Slicker RN,BSN,MHA [phone]

## 2014-04-20 NOTE — Progress Notes (Signed)
[facility] PROGRESS NOTE  Sonia Oliver ELF:[id number] DOB: 11-03-1950 DOA: 04/19/2014 PCP: Purvis Kilts, MD  Assessment/Plan: 1. Dysarthria- all the workup for the stroke is negative so far. Carotid ultrasound shows retrograde flow in the right vertebral artery. Neurology is following, they also recommend psych evaluation. Will request psychiatric evaluation to assess functional cause of dysarthria. 2. Hypotension- ? Cause, given fluid boluses at this time blood pressure is improved. Continue IV fluids at 125 MR per hour. Chest x-ray showed no disease, WBC normal, UA was clear. Urine culture has been obtained. Doubt patient has an infectious etiology for hypotension, likely due to the Ativan. Will change the Xanax to 0.25 mg 3 times a day when necessary 3. DVT prophylaxis- Lovenox  Code Status: Full code Family Communication: *Discussed with husband at bedside Disposition Plan: *Home when stable   Consultants:  Neurology  Procedures:  Carotid Doppler  Antibiotics:  None  HPI/Subjective: 64 y.o. female presents with an acute stroke. She state that she has felt weak all day long. She states after 2PM she suffered a fall. She states that she was in the bedroom at the time. She was on the bed at the time. She has had some speech difficulty with findings words. She is oriented to place though. She states that after this event she actually took a shower and walked to the car to come to the ER. The speech problem started around 1PM and resolved and then came back around 5PM. She states that she is trying to say the words but they are not coming out clear. She had a headache earlier. She states that this was around the time when her speech got bad. She states does not have a history of High blood pressure. She does have a history of colon cancer and was resected in 2011. Patient does smoke. She does not drink.   Today patient became hypotensive this morning requiring IV fluid  bolus. The patient was a symptomatic. She was alert and oriented 3. She is not on antihypertensive medication she received 1 dose of Ativan last night  Objective: Filed Vitals:   04/20/14 1312  BP: 103/57  Pulse: 63  Temp: 98.8 F (37.1 C)  Resp: 16   No intake or output data in the 24 hours ending 04/20/14 1519 Filed Weights   04/19/14 2300  Weight: 56.5 kg (124 lb 9 oz)    Exam:   General:  Appears in no acute distress  Cardiovascular: S1-S2 regular  Respiratory: Clear bilaterally  Abdomen: Soft nontender no organomegaly  Musculoskeletal: No edema  Data Reviewed: Basic Metabolic Panel:  Recent Labs Lab 04/19/14 1930 04/19/14 1948 04/20/14 1154  NA 140 141 143  K 4.0 3.9 4.6  CL 105 105 114*  CO2 22  --  27  GLUCOSE 106* 105* 94  BUN 14 16 11   CREATININE 0.91 0.90 0.87  CALCIUM 9.7  --  8.2*   Liver Function Tests:  Recent Labs Lab 04/19/14 1930 04/20/14 1154  AST 15 12  ALT 8 9  ALKPHOS 78 62  BILITOT 0.2* 0.6  PROT 7.1 5.7*  ALBUMIN 4.3 3.2*   No results for input(s): LIPASE, AMYLASE in the last 168 hours. No results for input(s): AMMONIA in the last 168 hours. CBC:  Recent Labs Lab 04/19/14 1930 04/19/14 1948 04/20/14 1154  WBC 8.4  --  6.8  NEUTROABS 5.0  --  4.0  HGB 14.0 15.6* 12.4  HCT 41.9 46.0 39.2  MCV 88.8  --  91.2  PLT 271  --  230   Cardiac Enzymes: No results for input(s): CKTOTAL, CKMB, CKMBINDEX, TROPONINI in the last 168 hours. BNP (last 3 results) No results for input(s): BNP in the last 8760 hours.  ProBNP (last 3 results) No results for input(s): PROBNP in the last 8760 hours.  CBG: No results for input(s): GLUCAP in the last 168 hours.  No results found for this or any previous visit (from the past 240 hour(s)).   Studies: Ct Head Wo Contrast  04/19/2014   CLINICAL DATA:  Code stroke. Last seen normal at 1700 hr. Slurred speech and frontal headache.  EXAM: CT HEAD WITHOUT CONTRAST  TECHNIQUE: Contiguous  axial images were obtained from the base of the skull through the vertex without intravenous contrast.  COMPARISON:  None.  FINDINGS: Ventricles and sulci appear symmetrical. No significant ventricular dilatation. No evidence of significant white matter disease. No mass effect or midline shift. No abnormal extra-axial fluid collections. Gray-white matter junctions are distinct. Basal cisterns are not effaced. No evidence of acute intracranial hemorrhage. No depressed skull fractures. Opacification of the right sphenoid sinus with mucosal thickening in some of the remaining paranasal sinuses. Mastoid air cells are not opacified. Vascular calcifications are present.  IMPRESSION: No acute intracranial abnormality. Opacification of the right sphenoid sinus.  These results were called by telephone at the time of interpretation on 04/19/2014 at 7:56 pm to Dr. Aram Beecham, who verbally acknowledged these results.   Electronically Signed   By: Lucienne Capers M.D.   On: 04/19/2014 19:58   Mr Jodene Nam Head Wo Contrast  04/19/2014   CLINICAL DATA:  Weakness for 1 day, fall at 2 p.m. while in her bedroom, subsequent speech difficulties. Acute headache. History of colon cancer.  EXAM: MRI HEAD WITHOUT CONTRAST  MRA HEAD WITHOUT CONTRAST  TECHNIQUE: Multiplanar, multiecho pulse sequences of the brain and surrounding structures were obtained without intravenous contrast. Angiographic images of the head were obtained using MRA technique without contrast.  COMPARISON:  CT of the head April 19, 2014 at 1952 hours  FINDINGS: MRI HEAD FINDINGS  No reduced diffusion to suggest acute ischemia. No susceptibility artifact to suggest hemorrhage.  Ventricles and sulci are normal for patient's age. Minimal white matter changes suggest chronic small vessel ischemic disease, less than expected for age. No midline shift, mass effect or mass lesions.  No abnormal extra-axial fluid collections. Prominent extra-axial spaces of the convexities, this  may reflect underlying parenchymal volume loss, normal variant. Ocular globes and orbital contents are unremarkable. T2 bright opacification of RIGHT sphenoid sinus, small LEFT sphenoid sinus air-fluid level. Bilateral mastoid effusions. No abnormal sellar expansion. No cerebellar tonsillar ectopia. No suspicious calvarial bone marrow signal.  MRA HEAD FINDINGS  Anterior circulation: Normal flow related enhancement of the included cervical, petrous, cavernous and supra clinoid internal carotid arteries. Mild irregularity of the cavernous carotid arteries bilaterally consistent with calcific atherosclerosis as seen on prior CT head. Patent anterior communicating artery, mild luminal irregularity. Normal flow related enhancement of the anterior and middle cerebral arteries, including more distal segments.  No large vessel occlusion, high-grade stenosis, abnormal luminal irregularity, aneurysm.  Posterior circulation: LEFT vertebral artery is dominant. No flow related enhancement of RIGHT vertebral artery V3 segment or proximal V4 segment. Small amount of retrograde flow and distal RIGHT V4 segment. Basilar artery is patent, with normal flow related enhancement of the main branch vessels. Normal flow related enhancement of the posterior cerebral arteries.  No large vessel occlusion, high-grade stenosis,  abnormal luminal irregularity, aneurysm.  IMPRESSION: MRI HEAD: No acute intracranial process, specifically no acute ischemia.  RIGHT sphenoid sinusitis.  Bilateral mastoid effusions.  MRA HEAD: Loss of RIGHT vertebral artery flow related enhancement concerning for occlusion (less likely slow flow), with apparent retrograde flow of into distal RIGHT vertebral artery. Findings would be better demonstrated on angiographic images of the neck.  Mild luminal regularity of the anterior communicating artery may reflect atherosclerosis or, artifact.   Electronically Signed   By: Elon Alas   On: 04/19/2014 23:31   Mr  Brain Wo Contrast  04/19/2014   CLINICAL DATA:  Weakness for 1 day, fall at 2 p.m. while in her bedroom, subsequent speech difficulties. Acute headache. History of colon cancer.  EXAM: MRI HEAD WITHOUT CONTRAST  MRA HEAD WITHOUT CONTRAST  TECHNIQUE: Multiplanar, multiecho pulse sequences of the brain and surrounding structures were obtained without intravenous contrast. Angiographic images of the head were obtained using MRA technique without contrast.  COMPARISON:  CT of the head April 19, 2014 at 1952 hours  FINDINGS: MRI HEAD FINDINGS  No reduced diffusion to suggest acute ischemia. No susceptibility artifact to suggest hemorrhage.  Ventricles and sulci are normal for patient's age. Minimal white matter changes suggest chronic small vessel ischemic disease, less than expected for age. No midline shift, mass effect or mass lesions.  No abnormal extra-axial fluid collections. Prominent extra-axial spaces of the convexities, this may reflect underlying parenchymal volume loss, normal variant. Ocular globes and orbital contents are unremarkable. T2 bright opacification of RIGHT sphenoid sinus, small LEFT sphenoid sinus air-fluid level. Bilateral mastoid effusions. No abnormal sellar expansion. No cerebellar tonsillar ectopia. No suspicious calvarial bone marrow signal.  MRA HEAD FINDINGS  Anterior circulation: Normal flow related enhancement of the included cervical, petrous, cavernous and supra clinoid internal carotid arteries. Mild irregularity of the cavernous carotid arteries bilaterally consistent with calcific atherosclerosis as seen on prior CT head. Patent anterior communicating artery, mild luminal irregularity. Normal flow related enhancement of the anterior and middle cerebral arteries, including more distal segments.  No large vessel occlusion, high-grade stenosis, abnormal luminal irregularity, aneurysm.  Posterior circulation: LEFT vertebral artery is dominant. No flow related enhancement of RIGHT  vertebral artery V3 segment or proximal V4 segment. Small amount of retrograde flow and distal RIGHT V4 segment. Basilar artery is patent, with normal flow related enhancement of the main branch vessels. Normal flow related enhancement of the posterior cerebral arteries.  No large vessel occlusion, high-grade stenosis, abnormal luminal irregularity, aneurysm.  IMPRESSION: MRI HEAD: No acute intracranial process, specifically no acute ischemia.  RIGHT sphenoid sinusitis.  Bilateral mastoid effusions.  MRA HEAD: Loss of RIGHT vertebral artery flow related enhancement concerning for occlusion (less likely slow flow), with apparent retrograde flow of into distal RIGHT vertebral artery. Findings would be better demonstrated on angiographic images of the neck.  Mild luminal regularity of the anterior communicating artery may reflect atherosclerosis or, artifact.   Electronically Signed   By: Elon Alas   On: 04/19/2014 23:31   Dg Chest Port 1 View  04/20/2014   CLINICAL DATA:  Pneumonia, cough  EXAM: PORTABLE CHEST - 1 VIEW  COMPARISON:  None.  FINDINGS: Cardiomediastinal silhouette is unremarkable. No acute infiltrate or pleural effusion. No pulmonary edema. Bony thorax is unremarkable.  IMPRESSION: No active disease.   Electronically Signed   By: Lahoma Crocker M.D.   On: 04/20/2014 12:06    Scheduled Meds: . ALPRAZolam  0.25 mg Oral TID  . aspirin  325 mg Oral Daily   Or  . aspirin  300 mg Rectal Daily  . atorvastatin  20 mg Oral q1800  . feeding supplement (ENSURE COMPLETE)  237 mL Oral BID BM   Continuous Infusions: . sodium chloride 125 mL/hr at 04/20/14 1039    Active Problems:   Stroke   Acute embolic stroke    Time spent: 25 minutes    [facility]  [facility] Pager [phone]*. If 7PM-7AM, please contact night-coverage at [email or web address], password [facility] 04/20/2014, 3:19 PM  LOS: 1 day

## 2014-04-20 NOTE — Progress Notes (Signed)
PT Cancellation Note  Patient Details Name: Sonia Oliver MRN: [id number] DOB: 11-Sep-1950   Cancelled Treatment:    Reason Eval/Treat Not Completed: Medical issues which prohibited therapy Pt on active bedrest. Will await for increase in activity orders prior to initiation of PT evaluation.   Candy Sledge A 04/20/2014, 9:02 AM  Candy Sledge, PT, DPT [phone]

## 2014-04-20 NOTE — Progress Notes (Signed)
Bolus complete, see vitals. Patient in no distress, patient denies any pain, neuro assessment unchanged. Will continue to monitor patient closely.

## 2014-04-20 NOTE — Progress Notes (Signed)
Patient returned from vascular and echo lab. VSS, see assessment. Patient is now in bed talking to family in the room. Call bell within patient's reach. Will continue to monitor.

## 2014-04-20 NOTE — Progress Notes (Addendum)
STROKE TEAM PROGRESS NOTE   HISTORY Sonia Oliver is an 64 y.o. female without relevant past medical history brought in by husband due to acute onset speech difficulty. Never had similar symptom before. She was last known well last night, and her family said that today around 1 pm they noticed mild slurred speech that went away but came back again approximately at 6 pm. Patient said that she had a fall at 2 or 3 pm and hit her head. In addition, she complains of HA and " a hot sensation" in her face. Michela Pitcher that she is doing her best " to speak normal" but sometimes is afraid that something is going wrong. Denies vertigo, double vision, difficulty swallowing, focal weakness, confusion, or visual disturbances. Initial NIHSS 5. CT brain revealed reviewed by myself showed no acute abnormality.  On daily aspirin.  Date last known well: 04/18/14 Time last known well: 10 pm tPA Given: no, out of the window NIHSS: 5   SUBJECTIVE (INTERVAL HISTORY) Multiple family members present. Patient is still having problems with speech. The family reports that the patient has been under a great deal of stress recently.   OBJECTIVE Temp:  [97.7 F (36.5 C)-98.6 F (37 C)] 98.6 F (37 C) (02/26 0804) Pulse Rate:  [51-72] 63 (02/26 0804) Cardiac Rhythm:  [-]  Resp:  [16-22] 18 (02/26 0600) BP: (73-171)/(48-83) 90/60 mmHg (02/26 0804) SpO2:  [94 %-100 %] 98 % (02/26 0804) Weight:  [56.5 kg (124 lb 9 oz)] 56.5 kg (124 lb 9 oz) (02/25 2300)  No results for input(s): GLUCAP in the last 168 hours.  Recent Labs Lab 04/19/14 1930 04/19/14 1948  NA 140 141  K 4.0 3.9  CL 105 105  CO2 22  --   GLUCOSE 106* 105*  BUN 14 16  CREATININE 0.91 0.90  CALCIUM 9.7  --     Recent Labs Lab 04/19/14 1930  AST 15  ALT 8  ALKPHOS 78  BILITOT 0.2*  PROT 7.1  ALBUMIN 4.3    Recent Labs Lab 04/19/14 1930 04/19/14 1948  WBC 8.4  --   NEUTROABS 5.0  --   HGB 14.0 15.6*  HCT 41.9 46.0  MCV 88.8  --    PLT 271  --    No results for input(s): CKTOTAL, CKMB, CKMBINDEX, TROPONINI in the last 168 hours.  Recent Labs  04/19/14 1930  LABPROT 12.1  INR 0.89    Recent Labs  04/19/14 2030  COLORURINE YELLOW  LABSPEC 1.005  PHURINE 7.5  GLUCOSEU NEGATIVE  HGBUR SMALL*  BILIRUBINUR NEGATIVE  KETONESUR NEGATIVE  PROTEINUR NEGATIVE  UROBILINOGEN 0.2  NITRITE NEGATIVE  LEUKOCYTESUR NEGATIVE       Component Value Date/Time   CHOL 192 04/20/2014 0028   TRIG 107 04/20/2014 0028   HDL 45 04/20/2014 0028   CHOLHDL 4.3 04/20/2014 0028   VLDL 21 04/20/2014 0028   LDLCALC 126* 04/20/2014 0028   No results found for: HGBA1C    Component Value Date/Time   LABOPIA NONE DETECTED 04/19/2014 2030   COCAINSCRNUR NONE DETECTED 04/19/2014 2030   LABBENZ NONE DETECTED 04/19/2014 2030   AMPHETMU NONE DETECTED 04/19/2014 2030   [place] DETECTED 04/19/2014 2030   LABBARB NONE DETECTED 04/19/2014 2030     Recent Labs Lab 04/19/14 1938  ETH <5    Ct Head Wo Contrast 04/19/2014    No acute intracranial abnormality. Opacification of the right sphenoid sinus.      Mr Jodene Nam Head Wo Contrast 04/19/2014  MRI HEAD:  No acute intracranial process, specifically no acute ischemia.  RIGHT sphenoid sinusitis.  Bilateral mastoid effusions.    MRA HEAD:  Loss of RIGHT vertebral artery flow related enhancement concerning for occlusion (less likely slow flow), with apparent retrograde flow of into distal RIGHT vertebral artery. Findings would be better demonstrated on angiographic images of the neck.  Mild luminal regularity of the anterior communicating artery may reflect atherosclerosis or, artifact.   Electronically Signed   By: Elon Alas   On: 04/19/2014 23:31      PHYSICAL EXAM Pleasant middle aged caucasian lady not in distress. Anxious.  . Afebrile. Head is nontraumatic. Neck is supple without bruit.    Cardiac exam no murmur or gallop. Lungs are clear to auscultation. Distal  pulses are well felt. Neurological Exam ;  Awake  Alert oriented x 3.Bizarre speech  With childlike quality and fluctations.[place] movements full without nystagmus.fundi were not visualized. Vision acuity and fields appear normal. Hearing is normal. Palatal movements are normal. Face symmetric. Tongue midline. Normal strength, tone, reflexes and coordination. Normal sensation. Gait deferred.  ASSESSMENT/PLAN Ms. Sonia Oliver is a 64 y.o. female with no significant past medical history presenting with transient speech difficulties, headache, and " hot sensation " in her face. She did not receive IV t-PA due to late presentation.   TIA:    Resultant - Speech difficulties.  MRI  no acute intercranial process.  MRA - Loss of RIGHT vertebral artery flow concerning for occlusion.   Carotid Doppler - pending  2D Echo - pending  LDL - 126  HgbA1c pending  SCDs for VTE prophylaxis  Diet Carb Modified with thin liquids  aspirin 81 mg orally every day prior to admission, now on aspirin 325 mg orally every day  Ongoing aggressive stroke risk factor management  Therapy recommendations:  Pending  Disposition: Pending  Hypertension  Home meds:   No antihypertensive medications prior to admission. No history of hypertension.  Blood pressure tends to run low. ( 73/48 )   Hyperlipidemia  Home meds:  No lipid lowering medications prior to admission.  LDL 126, goal < 70  Add Lipitor 20 mg daily  Continue statin at discharge    Other Stroke Risk Factors      Other Active Problems  Possible right vertebral artery occlusion - consider CTA    Other Pertinent History     PLAN   Add scheduled Xanax for anxiety per Dr Leonie Man  Tx to stepdown for hypotension.  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     Hospital day # 1  Mikey Bussing PA-C [facility] Pager [phone] 04/20/2014, 10:07 AM  I have personally  examined this patient, reviewed notes, independently viewed imaging studies, participated in medical decision making and plan of care. I have made any additions or clarifications directly to the above note. Agree with note above. She has bizarre speech with child like intonation likely nonorganic and has significant underlying psychosocial stressors. MRI brain shows no acute infarct. Continuing ongoing stroke eval and xamnax for anxeity and psychiatry consult. Long d/w husband , Dr Darrick Meigs and patient and answered questions Antony Contras, MD Medical Director [place] Pager: [phone] 04/20/2014 3:16 PM   To contact Stroke Continuity provider, please refer to [email or web address] After hours, contact General Neurology

## 2014-04-20 NOTE — Progress Notes (Signed)
RN spoke with MD, VSS, see vitals. Transfer orders held. Will continue to monitor patient. Patient is now sitting up in bed, eating lunch. Patient states she is in no pain. Call bell within patient's reach. Will continue to monitor.

## 2014-04-20 NOTE — Progress Notes (Signed)
INITIAL NUTRITION ASSESSMENT  DOCUMENTATION CODES Per approved criteria  -Not Applicable   INTERVENTION: Provide Ensure Complete BID in between meals, each supplement provides 350 kcal and 13 grams of protein RD to continue to monitor for PO adequacy and nutritional needs  NUTRITION DIAGNOSIS: Predicted sub optimal nutrient intake related to decreased appetite and recent stroke as evidenced by pt's chart .   Goal: Pt to meet >/= 90% of their estimated nutrition needs   Monitor:  PO intake, supplement acceptance, weight trend, labs  Reason for Assessment: Malnutrition Screening Tool, score of 2  64 y.o. female  Admitting Dx: <principal problem not specified>  ASSESSMENT: 64 y.o. female presents with an acute stroke. She does have a history of colon cancer and was resected in 2011.  Pt out of room at time of visit. Per Malnutrition Screening Tool filed in pt chart, pt reported eating poorly due to a decreased appetite and losing 2-13 lbs unintentionally PTA. Pt is currently on a Carb Modified diet; per RN note, pt ate lunch.    Labs: low calcium, slightly elevated LDL cholesterol  Height: Ht Readings from Last 1 Encounters:  04/19/14 5\' 3"  (1.6 m)    Weight: Wt Readings from Last 1 Encounters:  04/19/14 124 lb 9 oz (56.5 kg)    Ideal Body Weight: 115 lbs  % Ideal Body Weight: 108%  Wt Readings from Last 10 Encounters:  04/19/14 124 lb 9 oz (56.5 kg)    Usual Body Weight: unknown  % Usual Body Weight: NA  BMI:  Body mass index is 22.07 kg/(m^2).  Estimated Nutritional Needs: Kcal: 1500-1700 Protein: 70-80 grams Fluid: 1.5-1.7 L/day  Skin: intact  Diet Order: Diet Carb Modified  EDUCATION NEEDS: -No education needs identified at this time  No intake or output data in the 24 hours ending 04/20/14 1442  Last BM: 2/25   Labs:   Recent Labs Lab 04/19/14 1930 04/19/14 1948 04/20/14 1154  NA 140 141 143  K 4.0 3.9 4.6  CL 105 105 114*  CO2 22   --  27  BUN 14 16 11   CREATININE 0.91 0.90 0.87  CALCIUM 9.7  --  8.2*  GLUCOSE 106* 105* 94    CBG (last 3)  No results for input(s): GLUCAP in the last 72 hours.  Scheduled Meds: . ALPRAZolam  0.25 mg Oral TID  . aspirin  325 mg Oral Daily   Or  . aspirin  300 mg Rectal Daily  . atorvastatin  20 mg Oral q1800    Continuous Infusions: . sodium chloride 125 mL/hr at 04/20/14 1039    History reviewed. No pertinent past medical history.  History reviewed. No pertinent past surgical history.  Pryor Ochoa RD, LDN Inpatient Clinical Dietitian Pager: [phone] After Hours Pager: [phone]

## 2014-04-21 ENCOUNTER — Inpatient Hospital Stay (HOSPITAL_COMMUNITY): Payer: Medicare Other

## 2014-04-21 ENCOUNTER — Encounter (HOSPITAL_COMMUNITY): Payer: Self-pay | Admitting: Radiology

## 2014-04-21 DIAGNOSIS — F4323 Adjustment disorder with mixed anxiety and depressed mood: Secondary | ICD-10-CM

## 2014-04-21 DIAGNOSIS — F32A Depression, unspecified: Secondary | ICD-10-CM | POA: Diagnosis present

## 2014-04-21 DIAGNOSIS — I6340 Cerebral infarction due to embolism of unspecified cerebral artery: Secondary | ICD-10-CM

## 2014-04-21 DIAGNOSIS — F419 Anxiety disorder, unspecified: Secondary | ICD-10-CM

## 2014-04-21 DIAGNOSIS — F329 Major depressive disorder, single episode, unspecified: Secondary | ICD-10-CM | POA: Diagnosis present

## 2014-04-21 LAB — HEMOGLOBIN A1C
HEMOGLOBIN A1C: 6.1 % — AB (ref 4.8–5.6)
Hgb A1c MFr Bld: 6.1 % — ABNORMAL HIGH (ref 4.8–5.6)
MEAN PLASMA GLUCOSE: 128 mg/dL
Mean Plasma Glucose: 128 mg/dL

## 2014-04-21 MED ORDER — ATORVASTATIN CALCIUM 20 MG PO TABS: 20.0000 mg | ORAL_TABLET | Freq: Every day | ORAL | Status: DC

## 2014-04-21 MED ORDER — IOHEXOL 350 MG/ML SOLN
50.0000 mL | Freq: Once | INTRAVENOUS | Status: AC | PRN
  Administered 2014-04-21: 50 mL via INTRAVENOUS

## 2014-04-21 NOTE — Consult Note (Signed)
[facility] Face-to-Face Psychiatry Consult   Reason for Consult:  Adjustment disorder with anxiety Referring Physician:  Dr. Darrick Meigs Patient Identification: Sonia Oliver MRN:  [id number] Principal Diagnosis: Adjustment disorder with mixed anxiety and depressed mood Diagnosis:   Patient Active Problem List   Diagnosis Date Noted  . Adjustment disorder with mixed anxiety and depressed mood [F43.23] 04/21/2014  . Hypotension [I95.9] 04/20/2014  . Dysarthria [R47.1] 04/20/2014  . Stroke [I63.9] 04/19/2014  . Acute embolic stroke [L38.10] 17/51/0258    Total Time spent with patient: 45 minutes  Subjective:   Sonia Oliver is a 64 y.o. female patient admitted with anxiety and s/p stroke.  HPI:  Sonia Oliver is a 64 y.o. female seen, chart reviewed and case discussed with the Dr. Darrick Meigs and patient husband who is at bedside. Patient reported she has been under significant stress secondary to conflict with her great niece and husband not supporting her when that his a disagreement and also feels she does not need psychiatric treatment. Patient denies being depressed, anxious, mania, psychosis, suicidal, homicidal ideation, intentions or plans. Patient has a tangential thought process during this evaluation. Patient neurological workup is negative and shows improvement in her speech. Reportedly patient was seen a psychiatrist in the past and also primary care physician who prescribed medication for depression and anxiety but patient reportedly noncompliant with the treatment. Patient stated a that she or her father never believed and psychiatry care.  Medical history: Patient presents with an acute stroke. She state that she has felt weak all day long. She states after 2PM she suffered a fall. She states that she was in the bedroom at the time. She was on the bed at the time. She has had some speech difficulty with findings words. She is oriented to place though. She states that after this event she actually took a  shower and walked to the car to come to the ER. The speech problem started around 1PM and resolved and then came back around 5PM. She states that she is trying to say the words but they are not coming out clear. She had a headache earlier. She states that this was around the time when her speech got bad. She states does not have a history of High blood pressure. She does have a history of colon cancer and was resected in 2011. Patient does smoke. She does not drink.    Review of Systems:  Constitutional:  No weight loss, night sweats, Fevers, chills, fatigue.  HEENT:  ++ headaches,No sneezing, itching, ear ache, nasal congestion, post nasal drip,  Cardio-vascular:  No chest pain, Orthopnea, PND, swelling in lower extremities, anasarca, dizziness  GI:  No heartburn, indigestion, abdominal pain, nausea, vomiting, diarrhea  Resp:  No shortness of breath with exertion or at rest. No coughing up of blood.No wheezing  Skin:  no rash or lesions.  GU:  no dysuria, change in color of urine, no urgency or frequency Musculoskeletal:  No joint pain or swelling. No decreased range of motion. No back pain.  Psych:  No change in mood or affect. No depression or anxiety. No memory loss.   HPI Elements:   Location:  Anxiety. Quality:  Fair. Severity:  Status post stroke. Timing:  Acute stress secondary to conflict. Duration:  Few days. Context:  Psychosocial stressors.  Past Medical History: History reviewed. No pertinent past medical history. History reviewed. No pertinent past surgical history. Family History: History reviewed. No pertinent family history. Social History:  History  Alcohol Use: Not  on file     History  Drug Use Not on file    History   Social History  . Marital Status: Married    Spouse Name: N/A  . Number of Children: N/A  . Years of Education: N/A   Social History Main Topics  . Smoking status: Never Smoker   . Smokeless tobacco: Not on file  .  Alcohol Use: Not on file  . Drug Use: Not on file  . Sexual Activity: Not on file   Other Topics Concern  . None   Social History Narrative   Additional Social History:     Allergies:  No Known Allergies  Vitals: Blood pressure 137/79, pulse 51, temperature 97.8 F (36.6 C), temperature source Oral, resp. rate 18, height 5\' 3"  (1.6 m), weight 56.5 kg (124 lb 9 oz), SpO2 100 %.  Risk to Self: Is patient at risk for suicide?: No Risk to Others:   Prior Inpatient Therapy:   Prior Outpatient Therapy:    Current Facility-Administered Medications  Medication Dose Route Frequency Provider Last Rate Last Dose  . 0.9 %  sodium chloride infusion   Intravenous Continuous Oswald Hillock, MD 125 mL/hr at 04/21/14 [id number]    . acetaminophen (TYLENOL) tablet 650 mg  650 mg Oral Q4H PRN Allyne Gee, MD   650 mg at 04/20/14 0053   Or  . acetaminophen (TYLENOL) suppository 650 mg  650 mg Rectal Q4H PRN Allyne Gee, MD      . ALPRAZolam Duanne Moron) tablet 0.25 mg  0.25 mg Oral TID PRN Oswald Hillock, MD      . aspirin tablet 325 mg  325 mg Oral Daily Gardiner Barefoot, NP   325 mg at 04/21/14 0945   Or  . aspirin suppository 300 mg  300 mg Rectal Daily Gardiner Barefoot, NP      . atorvastatin (LIPITOR) tablet 20 mg  20 mg Oral q1800 David L Rinehuls, PA-C   20 mg at 04/20/14 1818  . feeding supplement (ENSURE COMPLETE) (ENSURE COMPLETE) liquid 237 mL  237 mL Oral BID BM Baird Lyons, RD   237 mL at 04/21/14 0945  . senna-docusate (Senokot-S) tablet 1 tablet  1 tablet Oral QHS PRN Allyne Gee, MD        Musculoskeletal: Strength & Muscle Tone: decreased Gait & Station: unable to stand Patient leans: N/A  Psychiatric Specialty Exam: Physical Exam as per history and physical   ROS anxiety, depression and speech difficulties   Blood pressure 137/79, pulse 51, temperature 97.8 F (36.6 C), temperature source Oral, resp. rate 18, height 5\' 3"  (1.6 m), weight 56.5 kg (124 lb 9 oz), SpO2 100  %.Body mass index is 22.07 kg/(m^2).  General Appearance: Guarded  Eye Contact::  Good  Speech:  Clear and Coherent  Volume:  Normal  Mood:  Anxious and Depressed  Affect:  Appropriate and Congruent  Thought Process:  Circumstantial and Tangential  Orientation:  Full (Time, Place, and Person)  Thought Content:  Rumination  Suicidal Thoughts:  No  Homicidal Thoughts:  No  Memory:  Immediate;   Fair Recent;   Fair  Judgement:  Impaired  Insight:  Shallow  Psychomotor Activity:  Decreased  Concentration:  Good  Recall:  Good  Fund of Knowledge:Good  Language: Good  Akathisia:  NA  Handed:  Right  AIMS (if indicated):     Assets:  Communication Skills Desire for Improvement Financial Resources/Insurance Housing Intimacy Leisure Time Resilience  Social Support  ADL's:  Impaired  Cognition: Impaired,  Mild  Sleep:      Medical Decision Making: New problem, with additional work up planned, Review of Psycho-Social Stressors (1), Review or order clinical lab tests (1), New Problem, with no additional work-up planned (3), Review or order medicine tests (1), Review of Medication Regimen & Side Effects (2) and Review of New Medication or Change in Dosage (2)  Treatment Plan Summary: Daily contact with patient to assess and evaluate symptoms and progress in treatment  Plan:  Recommend BuSpar 10 mg twice daily for anxiety Patient stated she does not want to see a psychiatrist in the community Patient does not meet criteria for psychiatric inpatient admission. Supportive therapy provided about ongoing stressors. Appreciate psychiatric consultation and follow up as clinically required Please contact [phone] or [phone] if needs further assistance  Disposition: May discharge to home with the family and referred to the outpatient medication management.  Sonia Oliver,Sonia R. 04/21/2014 12:11 PM

## 2014-04-21 NOTE — Progress Notes (Signed)
Patient [place] home via car with husband.  DC instructions and prescription information given and fully understood.  MD notified about patient's HR in the 100s and she was asymptomatic.  Assessments were stable and MD is ok with HR.

## 2014-04-21 NOTE — Progress Notes (Signed)
Speech Language Pathology Treatment: Cognitive-Linquistic  Patient Details Name: Sonia Oliver MRN: [id number] DOB: [birth year] Today's Date: 04/21/2014 Time: [id number] SLP Time Calculation (min) (ACUTE ONLY): 16 min  Assessment / Plan / Recommendation Clinical Impression  F/u after yesterday's assessment.  Pt for D/C home today.  Speech output is improved, with more fluent expression, fewer phonemic and anticipatory errors.  Pt engaged in lengthy conversation with no errors in articulation; then intermittent deficits near end of session.  Independent with self-correction.  No SLP f/u is warranted.     HPI HPI: 64 y.o. female with acute onset dysarthria, HA, and mild left sided weakness.  CT and MRI  negative.    Pertinent Vitals Pain Assessment: No/denies pain  SLP Plan  Discharge SLP treatment due to (comment) (D/C home)    Recommendations                Follow up Recommendations: None Plan: Discharge SLP treatment due to (comment) (D/C home)   Karenna Romanoff L. Tivis Ringer, [place] CCC/SLP Pager [phone]      Juan Quam Laurice 04/21/2014, 2:59 PM

## 2014-04-21 NOTE — Discharge Summary (Addendum)
Physician Discharge Summary  Sonia Oliver:[id number] DOB: [birth year] DOA: 04/19/2014  PCP: Sonia Kilts, MD  Admit date: 04/19/2014 Discharge date: 04/21/2014  Time spent: 50* minutes  Recommendations for Outpatient Follow-up:  1. Follow-up PCP in 2 weeks*  Discharge Diagnoses:  Principal Problem:   Adjustment disorder with mixed anxiety and depressed mood Active Problems:   Stroke   Acute embolic stroke   Hypotension   Dysarthria   Discharge Condition: *Stable  Diet recommendation: Low-salt diet  Filed Weights   04/19/14 2300  Weight: 56.5 kg (124 lb 9 oz)    History of present illness:  64 y.o. female  state that she has felt weak all day long. She states after 2PM she suffered a fall. She states that she was in the bedroom at the time. She was on the bed at the time. She has had some speech difficulty with findings words. She is oriented to place though. She states that after this event she actually took a shower and walked to the car to come to the ER. The speech problem started around 1PM and resolved and then came back around 5PM. She states that she is trying to say the words but they are not coming out clear. She had a headache earlier. She states that this was around the time when her speech got bad. She states does not have a history of High blood pressure. She does have a history of colon cancer and was resected in 2011. Patient does smoke. She does not drink.    Hospital Course:  1. Dysarthria- speech is improved, all the workup for the stroke is negative so far. Carotid ultrasound shows retrograde flow in the right vertebral artery. CT angio gram of the neck was done which did not show subclavian stenosis. Neurology has reviewed the CT angio results, and no new recommendation at this time. Psychiatric consultation was obtained, and she was seen by psychiatrist. At this time she does not want any medication.  2. Hypotension- resolved? Cause, given fluid  boluses at this time blood pressure is improved. Continue IV fluids at 125 MR per hour. Chest x-ray showed no disease, WBC normal, UA was clear. Urine culture has been obtained. Doubt patient has an infectious etiology for hypotension, likely due to the Ativan. Will change the Xanax to 0.25 mg 3 times a day when necessary  3. Bradycardia- patient has been bradycardic in the hospital with heart rate dropping to 40s and 50s, she has been totally symptom Sonia Oliver. Discussed with family member that if patient become symptomatic with dizziness or the heart rate continues to be low she is to call the primary care physician for outpatient cardiac evaluation. Telemetry shows bradycardia with no other abnormality.  Procedures:  Echocardiogram- showed EF 55-60%  Carotid duplex- showed right vertebral artery flow retrograde  Consultations:  Neurology  Psychiatry  Discharge Exam: Filed Vitals:   04/21/14 1005  BP: 137/79  Pulse: 51  Temp: 97.8 F (36.6 C)  Resp: 18    General: Appearing no acute distress Cardiovascular: *S1-S2 regular Respiratory: *Clear bilaterally  Discharge Instructions   Discharge Instructions    Diet - low sodium heart healthy    Complete by:  As directed      Increase activity slowly    Complete by:  As directed           Current Discharge Medication List    START taking these medications   Details  atorvastatin (LIPITOR) 20 MG tablet Take 1 tablet (  20 mg total) by mouth daily at 6 PM. Qty: 30 tablet, Refills: 2      CONTINUE these medications which have NOT CHANGED   Details  ALPRAZolam (XANAX) 0.5 MG tablet Take 0.25 mg by mouth at bedtime.    aspirin EC 81 MG tablet Take 81 mg by mouth daily.    OVER THE COUNTER MEDICATION Take 2 tablets by mouth 2 (two) times daily. For cholesterol       No Known Allergies    The results of significant diagnostics from this hospitalization (including imaging, microbiology, ancillary and laboratory) are listed  below for reference.    Significant Diagnostic Studies: Ct Head Wo Contrast  04/19/2014   CLINICAL DATA:  Code stroke. Last seen normal at 1700 hr. Slurred speech and frontal headache.  EXAM: CT HEAD WITHOUT CONTRAST  TECHNIQUE: Contiguous axial images were obtained from the base of the skull through the vertex without intravenous contrast.  COMPARISON:  None.  FINDINGS: Ventricles and sulci appear symmetrical. No significant ventricular dilatation. No evidence of significant white matter disease. No mass effect or midline shift. No abnormal extra-axial fluid collections. Gray-white matter junctions are distinct. Basal cisterns are not effaced. No evidence of acute intracranial hemorrhage. No depressed skull fractures. Opacification of the right sphenoid sinus with mucosal thickening in some of the remaining paranasal sinuses. Mastoid air cells are not opacified. Vascular calcifications are present.  IMPRESSION: No acute intracranial abnormality. Opacification of the right sphenoid sinus.  These results were called by telephone at the time of interpretation on 04/19/2014 at 7:56 pm to Dr. Aram Beecham, who verbally acknowledged these results.   Electronically Signed   By: Lucienne Capers M.D.   On: 04/19/2014 19:58   Ct Angio Neck W/cm &/or Wo/cm  04/21/2014   CLINICAL DATA:  Subclavian artery stenosis, difficulty speaking, slurred speech, intermittent frontal headaches.  EXAM: CT ANGIOGRAPHY NECK  TECHNIQUE: Multidetector CT imaging of the neck was performed using the standard protocol during bolus administration of intravenous contrast. Multiplanar CT image reconstructions and MIPs were obtained to evaluate the vascular anatomy. Carotid stenosis measurements (when applicable) are obtained utilizing NASCET criteria, using the distal internal carotid diameter as the denominator.  CONTRAST:  47mL OMNIPAQUE IOHEXOL 350 MG/ML SOLN  COMPARISON:  MRI of the brain January 18, 2015  FINDINGS: Aortic arch: Normal 3  vessel arch, moderate calcific atherosclerosis, widely patent origin of the innominate, LEFT Common carotid artery, subclavian artery. Streak artifact from retained LEFT subclavian venous contrast resultant shading through the LEFT subclavian artery artery.  Right carotid system: RIGHT Common carotid artery is normal course and caliber. Mild intimal thickening of the carotid bifurcation without hemodynamically significant stenosis by NASCET criteria. Normal appearance of the RIGHT internal carotid artery.  Left carotid system: LEFT Common carotid artery is normal in course and caliber, 1-2 mm eccentric intimal thickening and calcific atherosclerosis LEFT carotid bulb without hemodynamically significant stenosis. Normal appearance of the LEFT internal carotid artery.  Vertebral arteries:LEFT vertebral artery is dominant. Diminutive RIGHT vertebral artery appears represent congenital effacement as there is compensatory diminutive foramen transversarium. Poor contrast opacification of the RIGHT V3 segment, patent RIGHT vertebral artery V4 segment.  Skeleton: Moderate to severe C6-7 disc height loss, endplate spurring consistent with degenerative discs resulting in moderate to severe osseous neural foraminal narrowing.  Other neck: RIGHT sphenoid sinusitis. No acute process within the neck.  IMPRESSION: No CT angiographic findings of subclavian artery stenosis.  LEFT vertebral artery is dominant, diminutive RIGHT vertebral artery on congenital  basis with poor visualization of RIGHT V3 segment which may reflect chronic dissection or possibly artifact, patent RIGHT V4 segment.  No hemodynamically significant stenosis.   Electronically Signed   By: Elon Alas   On: 04/21/2014 05:04   Mr Jodene Nam Head Wo Contrast  04/19/2014   CLINICAL DATA:  Weakness for 1 day, fall at 2 p.m. while in her bedroom, subsequent speech difficulties. Acute headache. History of colon cancer.  EXAM: MRI HEAD WITHOUT CONTRAST  MRA HEAD  WITHOUT CONTRAST  TECHNIQUE: Multiplanar, multiecho pulse sequences of the brain and surrounding structures were obtained without intravenous contrast. Angiographic images of the head were obtained using MRA technique without contrast.  COMPARISON:  CT of the head April 19, 2014 at 1952 hours  FINDINGS: MRI HEAD FINDINGS  No reduced diffusion to suggest acute ischemia. No susceptibility artifact to suggest hemorrhage.  Ventricles and sulci are normal for patient's age. Minimal white matter changes suggest chronic small vessel ischemic disease, less than expected for age. No midline shift, mass effect or mass lesions.  No abnormal extra-axial fluid collections. Prominent extra-axial spaces of the convexities, this may reflect underlying parenchymal volume loss, normal variant. Ocular globes and orbital contents are unremarkable. T2 bright opacification of RIGHT sphenoid sinus, small LEFT sphenoid sinus air-fluid level. Bilateral mastoid effusions. No abnormal sellar expansion. No cerebellar tonsillar ectopia. No suspicious calvarial bone marrow signal.  MRA HEAD FINDINGS  Anterior circulation: Normal flow related enhancement of the included cervical, petrous, cavernous and supra clinoid internal carotid arteries. Mild irregularity of the cavernous carotid arteries bilaterally consistent with calcific atherosclerosis as seen on prior CT head. Patent anterior communicating artery, mild luminal irregularity. Normal flow related enhancement of the anterior and middle cerebral arteries, including more distal segments.  No large vessel occlusion, high-grade stenosis, abnormal luminal irregularity, aneurysm.  Posterior circulation: LEFT vertebral artery is dominant. No flow related enhancement of RIGHT vertebral artery V3 segment or proximal V4 segment. Small amount of retrograde flow and distal RIGHT V4 segment. Basilar artery is patent, with normal flow related enhancement of the main branch vessels. Normal flow  related enhancement of the posterior cerebral arteries.  No large vessel occlusion, high-grade stenosis, abnormal luminal irregularity, aneurysm.  IMPRESSION: MRI HEAD: No acute intracranial process, specifically no acute ischemia.  RIGHT sphenoid sinusitis.  Bilateral mastoid effusions.  MRA HEAD: Loss of RIGHT vertebral artery flow related enhancement concerning for occlusion (less likely slow flow), with apparent retrograde flow of into distal RIGHT vertebral artery. Findings would be better demonstrated on angiographic images of the neck.  Mild luminal regularity of the anterior communicating artery may reflect atherosclerosis or, artifact.   Electronically Signed   By: Elon Alas   On: 04/19/2014 23:31   Mr Brain Wo Contrast  04/19/2014   CLINICAL DATA:  Weakness for 1 day, fall at 2 p.m. while in her bedroom, subsequent speech difficulties. Acute headache. History of colon cancer.  EXAM: MRI HEAD WITHOUT CONTRAST  MRA HEAD WITHOUT CONTRAST  TECHNIQUE: Multiplanar, multiecho pulse sequences of the brain and surrounding structures were obtained without intravenous contrast. Angiographic images of the head were obtained using MRA technique without contrast.  COMPARISON:  CT of the head April 19, 2014 at 1952 hours  FINDINGS: MRI HEAD FINDINGS  No reduced diffusion to suggest acute ischemia. No susceptibility artifact to suggest hemorrhage.  Ventricles and sulci are normal for patient's age. Minimal white matter changes suggest chronic small vessel ischemic disease, less than expected for age. No midline shift,  mass effect or mass lesions.  No abnormal extra-axial fluid collections. Prominent extra-axial spaces of the convexities, this may reflect underlying parenchymal volume loss, normal variant. Ocular globes and orbital contents are unremarkable. T2 bright opacification of RIGHT sphenoid sinus, small LEFT sphenoid sinus air-fluid level. Bilateral mastoid effusions. No abnormal sellar expansion. No  cerebellar tonsillar ectopia. No suspicious calvarial bone marrow signal.  MRA HEAD FINDINGS  Anterior circulation: Normal flow related enhancement of the included cervical, petrous, cavernous and supra clinoid internal carotid arteries. Mild irregularity of the cavernous carotid arteries bilaterally consistent with calcific atherosclerosis as seen on prior CT head. Patent anterior communicating artery, mild luminal irregularity. Normal flow related enhancement of the anterior and middle cerebral arteries, including more distal segments.  No large vessel occlusion, high-grade stenosis, abnormal luminal irregularity, aneurysm.  Posterior circulation: LEFT vertebral artery is dominant. No flow related enhancement of RIGHT vertebral artery V3 segment or proximal V4 segment. Small amount of retrograde flow and distal RIGHT V4 segment. Basilar artery is patent, with normal flow related enhancement of the main branch vessels. Normal flow related enhancement of the posterior cerebral arteries.  No large vessel occlusion, high-grade stenosis, abnormal luminal irregularity, aneurysm.  IMPRESSION: MRI HEAD: No acute intracranial process, specifically no acute ischemia.  RIGHT sphenoid sinusitis.  Bilateral mastoid effusions.  MRA HEAD: Loss of RIGHT vertebral artery flow related enhancement concerning for occlusion (less likely slow flow), with apparent retrograde flow of into distal RIGHT vertebral artery. Findings would be better demonstrated on angiographic images of the neck.  Mild luminal regularity of the anterior communicating artery may reflect atherosclerosis or, artifact.   Electronically Signed   By: Elon Alas   On: 04/19/2014 23:31   Dg Chest Port 1 View  04/20/2014   CLINICAL DATA:  Pneumonia, cough  EXAM: PORTABLE CHEST - 1 VIEW  COMPARISON:  None.  FINDINGS: Cardiomediastinal silhouette is unremarkable. No acute infiltrate or pleural effusion. No pulmonary edema. Bony thorax is unremarkable.   IMPRESSION: No active disease.   Electronically Signed   By: Lahoma Crocker M.D.   On: 04/20/2014 12:06    Microbiology: No results found for this or any previous visit (from the past 240 hour(s)).   Labs: Basic Metabolic Panel:  Recent Labs Lab 04/19/14 1930 04/19/14 1948 04/20/14 1154  NA 140 141 143  K 4.0 3.9 4.6  CL 105 105 114*  CO2 22  --  27  GLUCOSE 106* 105* 94  BUN 14 16 11   CREATININE 0.91 0.90 0.87  CALCIUM 9.7  --  8.2*   Liver Function Tests:  Recent Labs Lab 04/19/14 1930 04/20/14 1154  AST 15 12  ALT 8 9  ALKPHOS 78 62  BILITOT 0.2* 0.6  PROT 7.1 5.7*  ALBUMIN 4.3 3.2*   No results for input(s): LIPASE, AMYLASE in the last 168 hours. No results for input(s): AMMONIA in the last 168 hours. CBC:  Recent Labs Lab 04/19/14 1930 04/19/14 1948 04/20/14 1154  WBC 8.4  --  6.8  NEUTROABS 5.0  --  4.0  HGB 14.0 15.6* 12.4  HCT 41.9 46.0 39.2  MCV 88.8  --  91.2  PLT 271  --  230   Cardiac Enzymes: No results for input(s): CKTOTAL, CKMB, CKMBINDEX, TROPONINI in the last 168 hours. BNP: BNP (last 3 results) No results for input(s): BNP in the last 8760 hours.  ProBNP (last 3 results) No results for input(s): PROBNP in the last 8760 hours.  CBG: No results for input(s):  GLUCAP in the last 168 hours.     SignedEleonore Chiquito S  [facility] 04/21/2014, 2:37 PM

## 2014-04-21 NOTE — Progress Notes (Signed)
STROKE TEAM PROGRESS NOTE   HISTORY Sonia Oliver is an 64 y.o. female without relevant past medical history brought in by husband due to acute onset speech difficulty. Never had similar symptom before. She was last known well last night, and her family said that today around 1 pm they noticed mild slurred speech that went away but came back again approximately at 6 pm. Patient said that she had a fall at 2 or 3 pm and hit her head. In addition, she complains of HA and " a hot sensation" in her face. Michela Pitcher that she is doing her best " to speak normal" but sometimes is afraid that something is going wrong. Denies vertigo, double vision, difficulty swallowing, focal weakness, confusion, or visual disturbances. Initial NIHSS 5. CT brain revealed reviewed by myself showed no acute abnormality.  On daily aspirin.  Date last known well: 04/18/14 Time last known well: 10 pm tPA Given: no, out of the window NIHSS: 5   SUBJECTIVE (INTERVAL HISTORY) Multiple family members present. Patient is still having problems with speech. Psychiatric consult pending.   OBJECTIVE Temp:  [98.1 F (36.7 C)-99 F (37.2 C)] 98.4 F (36.9 C) (02/27 0603) Pulse Rate:  [47-63] 47 (02/27 0603) Cardiac Rhythm:  [-] Sinus bradycardia (02/26 2129) Resp:  [15-18] 15 (02/27 0603) BP: (79-137)/(42-65) 118/53 mmHg (02/27 0603) SpO2:  [94 %-100 %] 100 % (02/27 0603)  No results for input(s): GLUCAP in the last 168 hours.  Recent Labs Lab 04/19/14 1930 04/19/14 1948 04/20/14 1154  NA 140 141 143  K 4.0 3.9 4.6  CL 105 105 114*  CO2 22  --  27  GLUCOSE 106* 105* 94  BUN 14 16 11   CREATININE 0.91 0.90 0.87  CALCIUM 9.7  --  8.2*    Recent Labs Lab 04/19/14 1930 04/20/14 1154  AST 15 12  ALT 8 9  ALKPHOS 78 62  BILITOT 0.2* 0.6  PROT 7.1 5.7*  ALBUMIN 4.3 3.2*    Recent Labs Lab 04/19/14 1930 04/19/14 1948 04/20/14 1154  WBC 8.4  --  6.8  NEUTROABS 5.0  --  4.0  HGB 14.0 15.6* 12.4  HCT 41.9  46.0 39.2  MCV 88.8  --  91.2  PLT 271  --  230   No results for input(s): CKTOTAL, CKMB, CKMBINDEX, TROPONINI in the last 168 hours.  Recent Labs  04/19/14 1930  LABPROT 12.1  INR 0.89    Recent Labs  04/19/14 2030  COLORURINE YELLOW  LABSPEC 1.005  PHURINE 7.5  GLUCOSEU NEGATIVE  HGBUR SMALL*  BILIRUBINUR NEGATIVE  KETONESUR NEGATIVE  PROTEINUR NEGATIVE  UROBILINOGEN 0.2  NITRITE NEGATIVE  LEUKOCYTESUR NEGATIVE       Component Value Date/Time   CHOL 192 04/20/2014 0028   TRIG 107 04/20/2014 0028   HDL 45 04/20/2014 0028   CHOLHDL 4.3 04/20/2014 0028   VLDL 21 04/20/2014 0028   LDLCALC 126* 04/20/2014 0028   No results found for: HGBA1C    Component Value Date/Time   LABOPIA NONE DETECTED 04/19/2014 2030   COCAINSCRNUR NONE DETECTED 04/19/2014 2030   LABBENZ NONE DETECTED 04/19/2014 2030   AMPHETMU NONE DETECTED 04/19/2014 2030   [place] DETECTED 04/19/2014 2030   LABBARB NONE DETECTED 04/19/2014 2030     Recent Labs Lab 04/19/14 1938  ETH <5    Ct Head Wo Contrast 04/19/2014    No acute intracranial abnormality. Opacification of the right sphenoid sinus.      Mr [facility] Contrast  04/19/2014    MRI HEAD:  No acute intracranial process, specifically no acute ischemia.  RIGHT sphenoid sinusitis.  Bilateral mastoid effusions.    MRA HEAD:  Loss of RIGHT vertebral artery flow related enhancement concerning for occlusion (less likely slow flow), with apparent retrograde flow of into distal RIGHT vertebral artery. Findings would be better demonstrated on angiographic images of the neck.  Mild luminal regularity of the anterior communicating artery may reflect atherosclerosis or, artifact.   Electronically Signed   By: Elon Alas   On: 04/19/2014 23:31     CT angiogram of the neck with and without contrast 04/21/2014 No CT angiographic findings of subclavian artery stenosis. LEFT vertebral artery is dominant, diminutive RIGHT vertebral  artery on congenital basis with poor visualization of RIGHT V3 segment which may reflect chronic dissection or possibly artifact, patent RIGHT V4 segment. No hemodynamically significant stenosis.     PHYSICAL EXAM Pleasant middle aged caucasian lady not in distress. Anxious.  . Afebrile. Head is nontraumatic. Neck is supple without bruit.    Cardiac exam no murmur or gallop. Lungs are clear to auscultation. Distal pulses are well felt. Neurological Exam ;  Awake  Alert oriented x 3.Bizarre speech  With childlike quality and fluctations.[place] movements full without nystagmus.fundi were not visualized. Vision acuity and fields appear normal. Hearing is normal. Palatal movements are normal. Face symmetric. Tongue midline. Normal strength, tone, reflexes and coordination. Normal sensation. Gait deferred.  ASSESSMENT/PLAN Ms. Sonia Oliver is a 64 y.o. female with no significant past medical history presenting with transient speech difficulties, headache, and " hot sensation " in her face. She did not receive IV t-PA due to late presentation.   TIA:    Resultant - Speech difficulties.  MRI  no acute intercranial process.  MRA - Loss of RIGHT vertebral artery flow concerning for occlusion.   CTA of Neck - see above  Carotid Doppler - Bilateral: 1-39% ICA stenosis. Right: Vertebral artery retrograde. Left: VA flow is antegrade.  2D Echo - EF 55-60%. No cardiac source of emboli identified  LDL - 126  HgbA1c - 6.1  SCDs for VTE prophylaxis  Diet Carb Modified with thin liquids  aspirin 81 mg orally every day prior to admission, now on aspirin 325 mg orally every day  Ongoing aggressive stroke risk factor management  Therapy recommendations:  Pending  Disposition: Pending  Hypertension  Home meds:   No antihypertensive medications prior to admission. No history of hypertension.  Blood pressure tends to run low. ( 73/48 )   Hyperlipidemia  Home meds:  No lipid lowering  medications prior to admission.  LDL 126, goal < 70  Add Lipitor 20 mg daily  Continue statin at discharge    Other Stroke Risk Factors      Other Active Problems  CTA results as noted above    Other Pertinent History     PLAN   Discussed results of CTA of the neck with Dr. Irish Elders - continue aspirin.  Checked orthostatic vitals - no orthostatic hypotension noted.  Psych consult pending  The stroke team will sign off at this time. Please call if we can be of further service.  Follow-up with Dr. Leonie Man in 2 months.    Hospital day # 2  Mikey Bussing [facility] Pager [phone] 04/21/2014, 8:59 AM     To contact Stroke Continuity provider, please refer to [email or web address] After hours, contact General Neurology

## 2014-04-21 NOTE — Evaluation (Signed)
Occupational Therapy Evaluation and Discharge Patient Details Name: Sonia Oliver MRN: [id number] DOB: [birth year] Today's Date: 04/21/2014    History of Present Illness Speech difficulties, MRII:negative,MRA:Loss of RIGHT vertebral artery flow concerning for occlusion. Question of speech issues related to stress. Is s/p fall hitting her head--pt reports she did not with force and that there was not a pump knot or noted sorness where she hit her head   Clinical Impression   This 64 yo female admitted with above presents to acute OT with no mobility or BADL deficits noted--made PT aware. Acute OT will sign off.    Follow Up Recommendations  No OT follow up    Equipment Recommendations  None recommended by OT       Precautions / Restrictions Precautions Precautions: Fall Restrictions Weight Bearing Restrictions: No      Mobility Bed Mobility Overal bed mobility: Independent                Transfers Overall transfer level: Independent               General transfer comment: Up and down 3 steps Mod I (used rail)         ADL Overall ADL's : Independent                                             Vision Vision Assessment?: Yes Eye Alignment: Within Functional Limits Ocular Range of Motion: Within Functional Limits Alignment/Gaze Preference: Within Defined Limits Tracking/Visual Pursuits: Able to track stimulus in all quads without difficulty Saccades: Within functional limits Convergence: Within functional limits Visual Fields: No apparent deficits          Pertinent Vitals/Pain Pain Assessment: No/denies pain     Hand Dominance Right   Extremity/Trunk Assessment Upper Extremity Assessment Upper Extremity Assessment: Overall WFL for tasks assessed   Lower Extremity Assessment Lower Extremity Assessment: Overall WFL for tasks assessed       Communication Communication Communication:  (Pt would occassionally mispronounce  words, but would then self correct)   Cognition Arousal/Alertness: Awake/alert Behavior During Therapy: WFL for tasks assessed/performed Overall Cognitive Status: Within Functional Limits for tasks assessed                                Home Living Family/patient expects to be discharged to:: Private residence Living Arrangements: Spouse/significant other Available Help at Discharge: Family;Available PRN/intermittently Type of Home: House Home Access: Stairs to enter [employer] of Steps: 4 Entrance Stairs-Rails: Left Home Layout: One level     Bathroom Shower/Tub: Tub/shower unit;Curtain Shower/tub characteristics: Architectural technologist: Standard     Home Equipment: None          Prior Functioning/Environment Level of Independence: Independent             OT Diagnosis: Generalized weakness         OT Goals(Current goals can be found in the care plan section) Acute Rehab OT Goals Patient Stated Goal: home today and I do not want to see a psychiatrist  OT Frequency:                End of Session Nurse Communication:  (Fine to walk arount unit with her husband)  Activity Tolerance: Patient tolerated treatment well Patient left: in chair;with call bell/phone within reach  Time: 1696-7893 OT Time Calculation (min): 25 min Charges:  OT General Charges $OT Visit: 1 Procedure OT Evaluation $Initial OT Evaluation Tier I: 1 Procedure OT Treatments $Self Care/Home Management : 8-22 mins   Almon Register [phone] 04/21/2014, 3:36 PM

## 2014-04-21 NOTE — Progress Notes (Signed)
PT Cancellation Note  Patient Details Name: Sonia Oliver MRN: [id number] DOB: [birth year]   Cancelled Treatment:    Reason Eval/Treat Not Completed: Pt remains on bedrest. Please update activity orders as appropriate and PT will proceed with evaluation.   Sonia Oliver 04/21/2014, 8:20 AM  Pager [phone]

## 2014-04-22 LAB — URINE CULTURE: Colony Count: 35000

## 2014-04-23 ENCOUNTER — Encounter (HOSPITAL_COMMUNITY): Payer: Self-pay | Admitting: *Deleted

## 2014-04-27 DIAGNOSIS — Z8673 Personal history of transient ischemic attack (TIA), and cerebral infarction without residual deficits: Secondary | ICD-10-CM | POA: Diagnosis not present

## 2014-04-27 DIAGNOSIS — Z85038 Personal history of other malignant neoplasm of large intestine: Secondary | ICD-10-CM | POA: Diagnosis not present

## 2014-04-27 DIAGNOSIS — F17200 Nicotine dependence, unspecified, uncomplicated: Secondary | ICD-10-CM | POA: Diagnosis not present

## 2014-04-27 DIAGNOSIS — Z6822 Body mass index (BMI) 22.0-22.9, adult: Secondary | ICD-10-CM | POA: Diagnosis not present

## 2014-04-27 DIAGNOSIS — R51 Headache: Secondary | ICD-10-CM | POA: Diagnosis not present

## 2014-04-27 DIAGNOSIS — G44209 Tension-type headache, unspecified, not intractable: Secondary | ICD-10-CM | POA: Diagnosis not present

## 2014-04-27 DIAGNOSIS — H53149 Visual discomfort, unspecified: Secondary | ICD-10-CM | POA: Diagnosis not present

## 2014-06-22 ENCOUNTER — Encounter: Payer: Self-pay | Admitting: *Deleted

## 2014-08-01 ENCOUNTER — Ambulatory Visit (INDEPENDENT_AMBULATORY_CARE_PROVIDER_SITE_OTHER): Payer: Medicare Other | Admitting: Family Medicine

## 2014-08-01 ENCOUNTER — Ambulatory Visit: Payer: Self-pay | Admitting: Neurology

## 2014-08-01 ENCOUNTER — Encounter: Payer: Self-pay | Admitting: Family Medicine

## 2014-08-01 VITALS — BP 122/70 | HR 84 | Temp 98.5°F | Resp 14 | Ht 64.0 in | Wt 132.0 lb

## 2014-08-01 DIAGNOSIS — E785 Hyperlipidemia, unspecified: Secondary | ICD-10-CM | POA: Diagnosis not present

## 2014-08-01 DIAGNOSIS — G4700 Insomnia, unspecified: Secondary | ICD-10-CM

## 2014-08-01 DIAGNOSIS — I639 Cerebral infarction, unspecified: Secondary | ICD-10-CM

## 2014-08-01 DIAGNOSIS — F5104 Psychophysiologic insomnia: Secondary | ICD-10-CM | POA: Insufficient documentation

## 2014-08-01 DIAGNOSIS — F4323 Adjustment disorder with mixed anxiety and depressed mood: Secondary | ICD-10-CM

## 2014-08-01 NOTE — Assessment & Plan Note (Signed)
Continue lipitor return for fasting labs

## 2014-08-01 NOTE — Assessment & Plan Note (Signed)
Continue xanax at bedtime

## 2014-08-01 NOTE — Patient Instructions (Addendum)
Release of records- Dr. Penelope Coop - [facility]- last Colonoscopy  Release of records from [facility] Return for fasitng labs F/U end of Dec for PHYSICAL

## 2014-08-01 NOTE — Progress Notes (Signed)
Patient ID: Sonia Oliver, female   DOB: 25-Jun-1950, 64 y.o.   MRN: [id number]   Subjective:    Patient ID: Sonia Oliver, female    DOB: 1950/05/10, 64 y.o.   MRN: [id number]  Patient presents for New Patient- Establish Care  patient here to establish care. Her previous primary care provider was [facility]. Medications and history were reviewed. She has history of mini stroke diagnosed back in February in general her cardiovascular and stroke workup was negative. She was placed on Lipitor secondary to mild hyperlipidemia and to decrease risk factors. She is being followed by [facility] neurology she is only on aspirin as a blood thinner.   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  History of colon cancer in 2011 she is followed by Dr. Penelope Coop  at [facility] gastroenterology.  History of hysterectomy in her 22s secondary to dysfunctional uterine bleeding she has not required any Pap smears.  History of anxiety and insomnia which is throughout her family. She does take Xanax which she takes 0.25 mg at bedtime as needed.    Review Of Systems:  GEN- denies fatigue, fever, weight loss,weakness, recent illness HEENT- denies eye drainage, change in vision, nasal discharge, CVS- denies chest pain, palpitations RESP- denies SOB, cough, wheeze ABD- denies N/V, change in stools, abd pain GU- denies dysuria, hematuria, dribbling, incontinence MSK- denies joint pain, muscle aches, injury Neuro- denies headache, dizziness, syncope, seizure activity       Objective:    BP 122/70 mmHg  Pulse 84  Temp(Src) 98.5 F (36.9 C) (Oral)  Resp 14  Ht 5\' 4"  (1.626 m)  Wt 132 lb (59.875 kg)  BMI 22.65 kg/m2 GEN- NAD, alert and oriented x3 HEENT- PERRL, EOMI, non injected sclera, pink conjunctiva, MMM, oropharynx clear Neck- Supple, no thyromegaly CVS- RRR, no murmur RESP-CTAB ABD-NABS,soft,NT,ND Psych- Normal affect and mood   EXT- No edema Pulses- Radial, DP- 2+        Assessment & Plan:      Problem List Items Addressed This Visit    None      Note: This dictation was prepared with Dragon dictation along with smaller phrase technology. Any transcriptional errors that result from this process are unintentional.

## 2014-08-14 ENCOUNTER — Other Ambulatory Visit: Payer: Medicare Other

## 2014-08-14 DIAGNOSIS — E785 Hyperlipidemia, unspecified: Secondary | ICD-10-CM | POA: Diagnosis not present

## 2014-08-14 LAB — CBC WITH DIFFERENTIAL/PLATELET
BASOS ABS: 0.1 10*3/uL (ref 0.0–0.1)
Basophils Relative: 1 % (ref 0–1)
EOS PCT: 3 % (ref 0–5)
Eosinophils Absolute: 0.2 10*3/uL (ref 0.0–0.7)
HCT: 41.2 % (ref 36.0–46.0)
HEMOGLOBIN: 14 g/dL (ref 12.0–15.0)
Lymphocytes Relative: 37 % (ref 12–46)
Lymphs Abs: 2.2 10*3/uL (ref 0.7–4.0)
MCH: 29.9 pg (ref 26.0–34.0)
MCHC: 34 g/dL (ref 30.0–36.0)
MCV: 88 fL (ref 78.0–100.0)
MPV: 9.7 fL (ref 8.6–12.4)
Monocytes Absolute: 0.4 10*3/uL (ref 0.1–1.0)
Monocytes Relative: 7 % (ref 3–12)
Neutro Abs: 3.1 10*3/uL (ref 1.7–7.7)
Neutrophils Relative %: 52 % (ref 43–77)
PLATELETS: 234 10*3/uL (ref 150–400)
RBC: 4.68 MIL/uL (ref 3.87–5.11)
RDW: 13.6 % (ref 11.5–15.5)
WBC: 5.9 10*3/uL (ref 4.0–10.5)

## 2014-08-14 LAB — COMPREHENSIVE METABOLIC PANEL
ALK PHOS: 67 U/L (ref 39–117)
ALT: 19 U/L (ref 0–35)
AST: 17 U/L (ref 0–37)
Albumin: 3.9 g/dL (ref 3.5–5.2)
BILIRUBIN TOTAL: 0.6 mg/dL (ref 0.2–1.2)
BUN: 12 mg/dL (ref 6–23)
CHLORIDE: 109 meq/L (ref 96–112)
CO2: 29 mEq/L (ref 19–32)
Calcium: 9.4 mg/dL (ref 8.4–10.5)
Creat: 0.93 mg/dL (ref 0.50–1.10)
GLUCOSE: 89 mg/dL (ref 70–99)
POTASSIUM: 4.6 meq/L (ref 3.5–5.3)
Sodium: 146 mEq/L — ABNORMAL HIGH (ref 135–145)
Total Protein: 6.4 g/dL (ref 6.0–8.3)

## 2014-08-14 LAB — LIPID PANEL
Cholesterol: 120 mg/dL (ref 0–200)
HDL: 46 mg/dL (ref >=46)
LDL CALC: 57 mg/dL (ref 0–99)
Total CHOL/HDL Ratio: 2.6 Ratio
Triglycerides: 85 mg/dL (ref <150)
VLDL: 17 mg/dL (ref 0–40)

## 2014-08-22 ENCOUNTER — Telehealth: Payer: Self-pay | Admitting: Family Medicine

## 2014-08-22 MED ORDER — ATORVASTATIN CALCIUM 20 MG PO TABS: 20.0000 mg | ORAL_TABLET | Freq: Every day | ORAL | Status: DC

## 2014-08-22 NOTE — Telephone Encounter (Signed)
Prescription for Lipitor sent to pharmacy.   Ok to refill Xanax??  Last office visit 08/01/2014.

## 2014-08-22 NOTE — Telephone Encounter (Signed)
[place] Pt is needing a refill on xanax and atorvastatin (LIPITOR) 20 MG tablet (pt is out of completley and does not have any for today to take)

## 2014-08-22 NOTE — Telephone Encounter (Signed)
Okay to refill, give 3  

## 2014-08-23 MED ORDER — ALPRAZOLAM 0.5 MG PO TABS: 0.2500 mg | ORAL_TABLET | Freq: Every day | ORAL | Status: DC

## 2014-08-23 NOTE — Telephone Encounter (Signed)
Medication called to pharmacy. 

## 2014-09-25 ENCOUNTER — Ambulatory Visit: Payer: Self-pay | Admitting: Neurology

## 2014-10-22 ENCOUNTER — Other Ambulatory Visit: Payer: Self-pay

## 2014-10-22 DIAGNOSIS — Z1231 Encounter for screening mammogram for malignant neoplasm of breast: Secondary | ICD-10-CM

## 2014-11-01 ENCOUNTER — Ambulatory Visit
Admission: RE | Admit: 2014-11-01 | Discharge: 2014-11-01 | Disposition: A | Discharge status: Home / Self Care | Payer: Medicare Other | Source: Ambulatory Visit

## 2014-11-01 DIAGNOSIS — Z1231 Encounter for screening mammogram for malignant neoplasm of breast: Secondary | ICD-10-CM | POA: Diagnosis not present

## 2014-12-21 ENCOUNTER — Other Ambulatory Visit: Payer: Self-pay | Admitting: Family Medicine

## 2014-12-21 NOTE — Telephone Encounter (Signed)
Medication refilled per protocol. 

## 2015-01-01 ENCOUNTER — Other Ambulatory Visit: Payer: Self-pay | Admitting: Family Medicine

## 2015-01-01 NOTE — Telephone Encounter (Signed)
okay

## 2015-01-01 NOTE — Telephone Encounter (Signed)
Med called in

## 2015-01-01 NOTE — Telephone Encounter (Signed)
Ok to refill??  Last office visit 07/27/2014.  Last refill 08/23/2014, #3 refills.

## 2015-02-19 ENCOUNTER — Encounter: Payer: Medicare Other | Admitting: Family Medicine

## 2015-02-22 ENCOUNTER — Encounter: Payer: Medicare Other | Admitting: Family Medicine

## 2015-03-01 ENCOUNTER — Other Ambulatory Visit: Payer: Self-pay | Admitting: Family Medicine

## 2015-03-01 ENCOUNTER — Other Ambulatory Visit: Payer: Medicare Other

## 2015-03-01 DIAGNOSIS — F4323 Adjustment disorder with mixed anxiety and depressed mood: Secondary | ICD-10-CM | POA: Diagnosis not present

## 2015-03-01 DIAGNOSIS — E785 Hyperlipidemia, unspecified: Secondary | ICD-10-CM

## 2015-03-01 DIAGNOSIS — Z79899 Other long term (current) drug therapy: Secondary | ICD-10-CM

## 2015-03-01 DIAGNOSIS — Z0000 Encounter for general adult medical examination without abnormal findings: Secondary | ICD-10-CM | POA: Diagnosis not present

## 2015-03-01 LAB — LIPID PANEL
CHOL/HDL RATIO: 2.6 ratio (ref <=5.0)
CHOLESTEROL: 131 mg/dL (ref 125–200)
HDL: 50 mg/dL (ref >=46)
LDL Cholesterol: 57 mg/dL (ref <130)
TRIGLYCERIDES: 121 mg/dL (ref <150)
VLDL: 24 mg/dL (ref <30)

## 2015-03-01 LAB — CBC WITH DIFFERENTIAL/PLATELET
Basophils Absolute: 0.1 10*3/uL (ref 0.0–0.1)
Basophils Relative: 1 % (ref 0–1)
EOS ABS: 0.2 10*3/uL (ref 0.0–0.7)
EOS PCT: 2 % (ref 0–5)
HCT: 41.9 % (ref 36.0–46.0)
Hemoglobin: 13.9 g/dL (ref 12.0–15.0)
LYMPHS ABS: 2.8 10*3/uL (ref 0.7–4.0)
Lymphocytes Relative: 36 % (ref 12–46)
MCH: 29.2 pg (ref 26.0–34.0)
MCHC: 33.2 g/dL (ref 30.0–36.0)
MCV: 88 fL (ref 78.0–100.0)
MONO ABS: 0.4 10*3/uL (ref 0.1–1.0)
MONOS PCT: 5 % (ref 3–12)
MPV: 10 fL (ref 8.6–12.4)
Neutro Abs: 4.4 10*3/uL (ref 1.7–7.7)
Neutrophils Relative %: 56 % (ref 43–77)
PLATELETS: 283 10*3/uL (ref 150–400)
RBC: 4.76 MIL/uL (ref 3.87–5.11)
RDW: 13.2 % (ref 11.5–15.5)
WBC: 7.9 10*3/uL (ref 4.0–10.5)

## 2015-03-01 LAB — COMPLETE METABOLIC PANEL WITH GFR
ALT: 19 U/L (ref 6–29)
AST: 16 U/L (ref 10–35)
Albumin: 4 g/dL (ref 3.6–5.1)
Alkaline Phosphatase: 60 U/L (ref 33–130)
BUN: 19 mg/dL (ref 7–25)
CHLORIDE: 104 mmol/L (ref 98–110)
CO2: 28 mmol/L (ref 20–31)
Calcium: 9.1 mg/dL (ref 8.6–10.4)
Creat: 1.06 mg/dL — ABNORMAL HIGH (ref 0.50–0.99)
GFR, EST AFRICAN AMERICAN: 64 mL/min (ref >=60)
GFR, EST NON AFRICAN AMERICAN: 55 mL/min — AB (ref >=60)
Glucose, Bld: 85 mg/dL (ref 70–99)
Potassium: 4.5 mmol/L (ref 3.5–5.3)
SODIUM: 143 mmol/L (ref 135–146)
Total Bilirubin: 0.5 mg/dL (ref 0.2–1.2)
Total Protein: 6.2 g/dL (ref 6.1–8.1)

## 2015-03-01 LAB — TSH: TSH: 2.626 u[IU]/mL (ref 0.350–4.500)

## 2015-03-04 ENCOUNTER — Encounter: Payer: Medicare Other | Admitting: Family Medicine

## 2015-03-22 ENCOUNTER — Other Ambulatory Visit: Payer: Self-pay | Admitting: Family Medicine

## 2015-03-22 NOTE — Telephone Encounter (Signed)
Refill appropriate and filled per protocol. 

## 2015-04-26 ENCOUNTER — Ambulatory Visit (INDEPENDENT_AMBULATORY_CARE_PROVIDER_SITE_OTHER): Payer: Medicare HMO | Admitting: Family Medicine

## 2015-04-26 ENCOUNTER — Encounter: Payer: Self-pay | Admitting: Family Medicine

## 2015-04-26 VITALS — BP 126/72 | HR 80 | Temp 98.1°F | Resp 14 | Ht 64.0 in | Wt 131.0 lb

## 2015-04-26 DIAGNOSIS — Z0000 Encounter for general adult medical examination without abnormal findings: Secondary | ICD-10-CM | POA: Diagnosis not present

## 2015-04-26 DIAGNOSIS — H9312 Tinnitus, left ear: Secondary | ICD-10-CM | POA: Diagnosis not present

## 2015-04-26 DIAGNOSIS — E785 Hyperlipidemia, unspecified: Secondary | ICD-10-CM | POA: Diagnosis not present

## 2015-04-26 DIAGNOSIS — Z13820 Encounter for screening for osteoporosis: Secondary | ICD-10-CM

## 2015-04-26 DIAGNOSIS — Z23 Encounter for immunization: Secondary | ICD-10-CM

## 2015-04-26 DIAGNOSIS — H9319 Tinnitus, unspecified ear: Secondary | ICD-10-CM | POA: Insufficient documentation

## 2015-04-26 DIAGNOSIS — H9192 Unspecified hearing loss, left ear: Secondary | ICD-10-CM | POA: Diagnosis not present

## 2015-04-26 MED ORDER — ATORVASTATIN CALCIUM 20 MG PO TABS: ORAL_TABLET | ORAL | Status: DC

## 2015-04-26 MED ORDER — DIAZEPAM 2 MG PO TABS: 2.0000 mg | ORAL_TABLET | Freq: Four times a day (QID) | ORAL | Status: DC | PRN

## 2015-04-26 NOTE — Patient Instructions (Addendum)
Referral to ENT Try the Valium for your ear  Call and set up Bone Density  Shingles vaccine sent to pharmacy Labs look good F/U 6 months

## 2015-04-26 NOTE — Assessment & Plan Note (Signed)
Lipids normal, labs reviewed

## 2015-04-26 NOTE — Assessment & Plan Note (Signed)
Chronic tinnitus- loss of hearing, also with dizziness, ? Genetic related Send back to ENT Trial of Valium 2mg  to see if this helps better than xanax

## 2015-04-26 NOTE — Progress Notes (Signed)
Patient ID: Sonia Oliver, female   DOB: 1950-10-15, 65 y.o.   MRN: [id number] Subjective:   Patient presents for Medicare Annual/Subsequent preventive examination.     Patient frail well as exam. She continues to have difficulty with chronic tinnitus and dizzy spells associated. She is deaf in her left ear. She does not have a here. He has been sometimes itches been evaluated by ear nose and throat. He does feel like her ears are clogged up at times. She does tell me that her sister is deaf in her right ear of unknown cause.  Review Past Medical/Family/Social: Per EMR    Risk Factors  Current exercise habits: walks  Dietary issues discussed: None   Cardiac risk factors: Hyperlipidemia   Depression Screen  (Note: if answer to either of the following is "Yes", a more complete depression screening is indicated)  Over the past two weeks, have you felt down, depressed or hopeless? No Over the past two weeks, have you felt little interest or pleasure in doing things? No Have you lost interest or pleasure in daily life? No Do you often feel hopeless? No Do you cry easily over simple problems? No   Activities of Daily Living  In your present state of health, do you have any difficulty performing the following activities?:  Driving? No  Managing money? No  Feeding yourself? No  Getting from bed to chair? No  Climbing a flight of stairs? No  Preparing food and eating?: No  Bathing or showering? No  Getting dressed: No  Getting to the toilet? No  Using the toilet:No  Moving around from place to place: No  In the past year have you fallen or had a near fall?:No  Are you sexually active? No  Do you have more than one partner? No   Hearing Difficulties: yes  Do you often ask people to speak up or repeat themselves? yes Do you experience ringing or noises in your ears? yes Do you have difficulty understanding soft or whispered voices? yes Do you feel that you have a problem with memory?  No Do you often misplace items? Yes Do you feel safe at home? Yes  Cognitive Testing  Alert? Yes Normal Appearance?Yes  Oriented to person? Yes Place? Yes  Time? Yes  Recall of three objects? Yes  Can perform simple calculations? Yes  Displays appropriate judgment?Yes  Can read the correct time from a watch face?Yes   List the Names of Other Physician/Practitioners you currently use: None  Screening Tests / Date Colonoscopy  -    Due in April- Dr. Penelope Coop                Zostavax - send to pharmacy Mammogram UTD Influenza Vaccine declines  Tetanus/tdap - unable due to finances  ROS: GEN- denies fatigue, fever, weight loss,weakness, recent illness HEENT- denies eye drainage, change in vision, nasal discharge, CVS- denies chest pain, palpitations RESP- denies SOB, cough, wheeze ABD- denies N/V, change in stools, abd pain GU- denies dysuria, hematuria, dribbling, incontinence MSK- denies joint pain, muscle aches, injury Neuro- denies headache, dizziness, syncope, seizure activity  PHYSICAL: GEN- NAD, alert and oriented x3 HEENT- PERRL, EOMI, non injected sclera, pink conjunctiva, MMM, oropharynx clear, rIGHT canal impacted wax, Left canal clear  Neck- Supple, no thryomegaly CVS- RRR, no murmur RESP-CTAB ABD-NABS,soft,NT,ND Neuro-CNII-XII intact  EXT- No edema Pulses- Radial, DP- 2+      Assessment:    Annual wellness medicare exam   Plan:  During the course of the visit the patient was educated and counseled about appropriate screening and preventive services including:  Screening mammography  Bone Density to be done  Prevnar 13 given Shingles vaccine. Prescription given to that she can get the vaccine at the pharmacy or Medicare part D.  Screen NEG for depression. Diet review for nutrition referral? Yes ____ Not Indicated __x__  Patient Instructions (the written plan) was given to the patient.  Medicare Attestation  I have personally reviewed:  The patient's  medical and social history  Their use of alcohol, tobacco or illicit drugs  Their current medications and supplements  The patient's functional ability including ADLs,fall risks, home safety risks, cognitive, and hearing and visual impairment  Diet and physical activities  Evidence for depression or mood disorders  The patient's weight, height, BMI, and visual acuity have been recorded in the chart. I have made referrals, counseling, and provided education to the patient based on review of the above and I have provided the patient with a written personalized care plan for preventive services.

## 2015-04-29 ENCOUNTER — Other Ambulatory Visit: Payer: Self-pay | Admitting: *Deleted

## 2015-04-29 MED ORDER — ZOSTER VACCINE LIVE 19400 UNT/0.65ML ~~LOC~~ SOLR: 0.6500 mL | Freq: Once | SUBCUTANEOUS | Status: DC

## 2015-04-30 ENCOUNTER — Ambulatory Visit (INDEPENDENT_AMBULATORY_CARE_PROVIDER_SITE_OTHER): Payer: Medicare HMO | Admitting: Family Medicine

## 2015-04-30 VITALS — BP 132/86 | Temp 98.2°F

## 2015-04-30 DIAGNOSIS — T50Z95A Adverse effect of other vaccines and biological substances, initial encounter: Secondary | ICD-10-CM | POA: Diagnosis not present

## 2015-04-30 DIAGNOSIS — Z85038 Personal history of other malignant neoplasm of large intestine: Secondary | ICD-10-CM | POA: Diagnosis not present

## 2015-04-30 MED ORDER — ALPRAZOLAM 0.5 MG PO TABS: ORAL_TABLET | ORAL | Status: DC

## 2015-04-30 NOTE — Patient Instructions (Signed)
Go back to xanax Take Benadryl 1/2 tablet twice a day for the next 2-3 days   F/U as previous

## 2015-04-30 NOTE — Progress Notes (Signed)
Patient ID: Sonia Oliver, female   DOB: [birth year], 65 y.o.   MRN: [id number]   Subjective:    Patient ID: Sonia Oliver, female    DOB: 06/24/1950, 65 y.o.   MRN: [id number]  Patient presents for reaction to vaccine  patient with reaction to her vaccination. She was here on Monday the third at that time she received her Prevnar 13 she noticed a small up on her right buttocks where she received a shot over the past 28 hours this was enlarged to a larger red circle however now it is no longer as painful as before she also noted a small knot in her right inguinal region. She's not had any fever no chills she has not had any drainage.  She also states that she try the Valium the other night although this was in the setting of the reaction to the immunization she had some diarrhea and she felt sick, stomach she thinks it was due to the Valium she does not want to take the medication any further she wants to return back to her Xanax. At time she has been taking Xanax during the daytime and a half a tablet at night.    Review Of Systems:  GEN- denies fatigue, fever, weight loss,weakness, recent illness HEENT- denies eye drainage, change in vision, nasal discharge, CVS- denies chest pain, palpitations RESP- denies SOB, cough, wheeze ABD- denies N/V, change in stools, abd pain GU- denies dysuria, hematuria, dribbling, incontinence MSK- denies joint pain, muscle aches, injury Neuro- denies headache, dizziness, syncope, seizure activity       Objective:    BP 132/86 mmHg  Temp(Src) 98.2 F (36.8 C) (Oral) GEN- NAD, alert and oriented x3 Skin 10x11cm area of erythema, small nodule a center- previous injections site, NT, no fluctance GU-        Assessment & Plan:      Problem List Items Addressed This Visit    None    Visit Diagnoses    Immunization reaction, initial encounter    -  Primary    Localized reacation, okay to use benadryl, already improving, no sign of infection, lymph  node, very shotty may be slighlty reactive. Also switch back to xanax,       Note: This dictation was prepared with Dragon dictation along with smaller phrase technology. Any transcriptional errors that result from this process are unintentional.

## 2015-05-10 ENCOUNTER — Encounter: Payer: Self-pay | Admitting: *Deleted

## 2015-05-22 ENCOUNTER — Encounter: Payer: Self-pay | Admitting: *Deleted

## 2015-06-04 ENCOUNTER — Ambulatory Visit (HOSPITAL_COMMUNITY)
Admission: RE | Admit: 2015-06-04 | Discharge: 2015-06-04 | Disposition: A | Discharge status: Home / Self Care | Payer: Medicare HMO | Source: Ambulatory Visit | Attending: Family Medicine | Admitting: Family Medicine

## 2015-06-04 DIAGNOSIS — Z780 Asymptomatic menopausal state: Secondary | ICD-10-CM | POA: Diagnosis not present

## 2015-06-04 DIAGNOSIS — M85851 Other specified disorders of bone density and structure, right thigh: Secondary | ICD-10-CM | POA: Diagnosis not present

## 2015-06-04 DIAGNOSIS — Z13820 Encounter for screening for osteoporosis: Secondary | ICD-10-CM | POA: Insufficient documentation

## 2015-06-04 DIAGNOSIS — M8588 Other specified disorders of bone density and structure, other site: Secondary | ICD-10-CM | POA: Insufficient documentation

## 2015-06-20 ENCOUNTER — Other Ambulatory Visit: Payer: Self-pay | Admitting: Gastroenterology

## 2015-06-20 DIAGNOSIS — D123 Benign neoplasm of transverse colon: Secondary | ICD-10-CM | POA: Diagnosis not present

## 2015-06-20 DIAGNOSIS — Z85038 Personal history of other malignant neoplasm of large intestine: Secondary | ICD-10-CM | POA: Diagnosis not present

## 2015-06-20 DIAGNOSIS — D126 Benign neoplasm of colon, unspecified: Secondary | ICD-10-CM | POA: Diagnosis not present

## 2015-06-20 DIAGNOSIS — D124 Benign neoplasm of descending colon: Secondary | ICD-10-CM | POA: Diagnosis not present

## 2015-06-20 DIAGNOSIS — K5730 Diverticulosis of large intestine without perforation or abscess without bleeding: Secondary | ICD-10-CM | POA: Diagnosis not present

## 2015-06-20 LAB — HM COLONOSCOPY

## 2015-07-23 DIAGNOSIS — R69 Illness, unspecified: Secondary | ICD-10-CM | POA: Diagnosis not present

## 2015-07-25 DIAGNOSIS — H524 Presbyopia: Secondary | ICD-10-CM | POA: Diagnosis not present

## 2015-07-25 DIAGNOSIS — Z0100 Encounter for examination of eyes and vision without abnormal findings: Secondary | ICD-10-CM | POA: Diagnosis not present

## 2015-07-28 ENCOUNTER — Other Ambulatory Visit: Payer: Self-pay | Admitting: Family Medicine

## 2015-07-29 NOTE — Telephone Encounter (Signed)
Okay to refill? 

## 2015-07-29 NOTE — Telephone Encounter (Signed)
Xanax called into [facility] 

## 2015-07-29 NOTE — Telephone Encounter (Signed)
Xanax refill request.  Last seen 04/30/2015, last filled 04/30/2015.  Please advise.

## 2015-09-16 ENCOUNTER — Ambulatory Visit (INDEPENDENT_AMBULATORY_CARE_PROVIDER_SITE_OTHER): Payer: Medicare HMO | Admitting: Family Medicine

## 2015-09-16 ENCOUNTER — Encounter: Payer: Self-pay | Admitting: Family Medicine

## 2015-09-16 VITALS — BP 128/72 | HR 82 | Temp 98.4°F | Resp 16 | Ht 64.0 in | Wt 135.0 lb

## 2015-09-16 DIAGNOSIS — J302 Other seasonal allergic rhinitis: Secondary | ICD-10-CM

## 2015-09-16 DIAGNOSIS — H938X2 Other specified disorders of left ear: Secondary | ICD-10-CM

## 2015-09-16 NOTE — Progress Notes (Signed)
    Subjective:    Patient ID: Sonia Oliver, female    DOB: Nov 06, 1950, 65 y.o.   MRN: [id number]  Patient presents for Ear Pressure (B ear pain- states that it feels like her ears are congested) and Memory Changes (reports that she is having issues with memory and reports "episodes" of not remembering- states that she had HA after episode and is concerned about it being more mini strokes)  Feels like both ears are clogged up, for past few weeks, Has had sinus drainage, itchy eyes and sneezing for the past couple weeks. She typically takes Allegra but has not taken any recently. She feels like her ear needs to pop.   Had episode at the hospital abpout 2 months ago while she was visiting her brother. She states her brother has been very ill he's had multiple bypass surgeries they thought they were going to lose him she went to visit him at the hospital and had an acute episode of confusion. She states she does not remember what the conversation was between her and her brother. She then remembers leaving out and unable to recognize her birthday. She then had a headache afterwards and the symptoms resolved. She's not had any symptoms since. She did have workup for stroke back in February of last year by neurology.   Normal MRI/MRA in Feb 2016, after similar episode      Review Of Systems:  GEN- denies fatigue, fever, weight loss,weakness, recent illness HEENT- denies eye drainage, change in vision, nasal discharge, CVS- denies chest pain, palpitations RESP- denies SOB, cough, wheeze ABD- denies N/V, change in stools, abd pain GU- denies dysuria, hematuria, dribbling, incontinence MSK- denies joint pain, muscle aches, injury Neuro- denies headache, dizziness, syncope, seizure activity       Objective:    BP 128/72 (BP Location: Left Arm, Patient Position: Sitting, Cuff Size: Normal)   Pulse 82   Temp 98.4 F (36.9 C) (Oral)   Resp 16   Ht 5\' 4"  (1.626 m)   Wt 135 lb (61.2 kg)   BMI  23.17 kg/m  GEN- NAD, alert and oriented x3 HEENT- PERRL, EOMI, non injected sclera, pink conjunctiva, MMM, oropharynx clear, nares clear - enlarged left turbinate, no maxillary or frontal sinus tenderness. TM Clear canal clear  Neck- Supple, no bruit , no LAD  CVS- RRR, no murmur RESP-CTAB Neuro-CNII-XII intact, no new deficits, no nystagmus, speech normal, normal tone, moving ext equally  EXT- No edema Pulses- Radial  2+        Assessment & Plan:      Problem List Items Addressed This Visit    None    Visit Diagnoses    Other seasonal allergic rhinitis    -  Primary   Trial of allegra D and nasal steroid/nasal saline, no cerumen impaction. With regards to the confused episode she had possible under stress visiting her brother. other possibilities TIA advised why she did not seek care at the time she could not give me a good answer. This time she does not have any focal deficits will hold off on imaging at this time and see if  she has any recurrent episodes. She has had stroke workup a year ago.    Ear congestion, left          Note: This dictation was prepared with Dragon dictation along with smaller phrase technology. Any transcriptional errors that result from this process are unintentional.

## 2015-09-16 NOTE — Patient Instructions (Signed)
Try the allegra D for sinus pressure Call if you get another episode of confusion and MRI will be done  F/U as previous

## 2015-11-05 ENCOUNTER — Other Ambulatory Visit: Payer: Self-pay | Admitting: Family Medicine

## 2015-11-07 NOTE — Telephone Encounter (Signed)
Ok to refill??  Last office visit 09/16/2015.  Last refill 07/29/2015.

## 2015-11-08 ENCOUNTER — Telehealth: Payer: Self-pay | Admitting: *Deleted

## 2015-11-08 NOTE — Telephone Encounter (Signed)
Medication has been called to pharmacy.

## 2015-11-08 NOTE — Telephone Encounter (Signed)
Patient called requesting her xanax to be refilled. Please advise [phone]

## 2015-11-08 NOTE — Telephone Encounter (Signed)
okay

## 2015-11-08 NOTE — Telephone Encounter (Signed)
Medication called to pharmacy. 

## 2015-11-27 ENCOUNTER — Encounter: Payer: Self-pay | Admitting: Family Medicine

## 2015-11-27 ENCOUNTER — Ambulatory Visit (HOSPITAL_COMMUNITY)
Admission: RE | Admit: 2015-11-27 | Discharge: 2015-11-27 | Disposition: A | Discharge status: Home / Self Care | Payer: Medicare HMO | Source: Ambulatory Visit | Attending: Family Medicine | Admitting: Family Medicine

## 2015-11-27 ENCOUNTER — Ambulatory Visit (INDEPENDENT_AMBULATORY_CARE_PROVIDER_SITE_OTHER): Payer: Medicare HMO | Admitting: Family Medicine

## 2015-11-27 ENCOUNTER — Other Ambulatory Visit: Payer: Self-pay | Admitting: Family Medicine

## 2015-11-27 VITALS — BP 122/74 | HR 76 | Temp 98.7°F | Resp 14 | Ht 64.0 in | Wt 133.0 lb

## 2015-11-27 DIAGNOSIS — M25551 Pain in right hip: Secondary | ICD-10-CM

## 2015-11-27 DIAGNOSIS — M1611 Unilateral primary osteoarthritis, right hip: Secondary | ICD-10-CM | POA: Insufficient documentation

## 2015-11-27 DIAGNOSIS — M7989 Other specified soft tissue disorders: Secondary | ICD-10-CM | POA: Diagnosis not present

## 2015-11-27 DIAGNOSIS — Z23 Encounter for immunization: Secondary | ICD-10-CM

## 2015-11-27 MED ORDER — NAPROXEN 500 MG PO TABS: 500.0000 mg | ORAL_TABLET | Freq: Two times a day (BID) | ORAL | 1 refills | Status: DC

## 2015-11-27 NOTE — Progress Notes (Signed)
   Subjective:    Patient ID: Sonia Oliver, female    DOB: 04/05/1950, 65 y.o.   MRN: [id number]  Patient presents for Leg Pain (R hip pain when patient moves in certain way or if sitting for long periods of time.) pt here with right groin pain for the past month. She's had some intermittent stiffness before that but is having constant the past month. She has pain when she sits low chairs or when she rotates her leg. She denies any back pain no change in bowel bladder no tingling or numbness in her lower extremities.   No fall or injury   Review Of Systems:  GEN- denies fatigue, fever, weight loss,weakness, recent illness HEENT- denies eye drainage, change in vision, nasal discharge, CVS- denies chest pain, palpitations RESP- denies SOB, cough, wheeze ABD- denies N/V, change in stools, abd pain GU- denies dysuria, hematuria, dribbling, incontinence MSK- + joint pain, muscle aches, injury Neuro- denies headache, dizziness, syncope, seizure activity       Objective:    BP 122/74 (BP Location: Right Arm, Patient Position: Sitting, Cuff Size: Normal)   Pulse 76   Temp 98.7 F (37.1 C) (Oral)   Resp 14   Ht 5\' 4"  (1.626 m)   Wt 133 lb (60.3 kg)   BMI 22.83 kg/m  GEN- NAD, alert and oriented x3 MSK- spine NT, neg SLR, TTP in right groin, pain with ER, neg hip rock, good ROM bilat knees, no effusion, non antalgic gait  EXT- No edema Pulses- Radial  2+        Assessment & Plan:      Problem List Items Addressed This Visit    None    Visit Diagnoses    Right hip pain    -  Primary   Concern for DJD in hip, she uas underlying osteopenia as well. obtain xray, start naprosyn BID, ortho pendind evaluation, no recent fall or injury   Relevant Orders   DG HIP UNILAT W OR W/O PELVIS 2-3 VIEWS RIGHT      Note: This dictation was prepared with Dragon dictation along with smaller phrase technology. Any transcriptional errors that result from this process are unintentional.

## 2015-11-27 NOTE — Patient Instructions (Signed)
Get xray of hip Start anti-inflammatory F/u as previous

## 2015-11-27 NOTE — Addendum Note (Signed)
Addended by: Sheral Flow on: 11/27/2015 11:55 AM   Modules accepted: Orders

## 2015-11-28 ENCOUNTER — Other Ambulatory Visit: Payer: Self-pay | Admitting: Family Medicine

## 2015-11-28 DIAGNOSIS — Z1231 Encounter for screening mammogram for malignant neoplasm of breast: Secondary | ICD-10-CM

## 2015-12-06 ENCOUNTER — Ambulatory Visit: Payer: Self-pay

## 2015-12-24 ENCOUNTER — Ambulatory Visit
Admission: RE | Admit: 2015-12-24 | Discharge: 2015-12-24 | Disposition: A | Discharge status: Home / Self Care | Payer: Medicare HMO | Source: Ambulatory Visit | Attending: Family Medicine | Admitting: Family Medicine

## 2015-12-24 DIAGNOSIS — Z1231 Encounter for screening mammogram for malignant neoplasm of breast: Secondary | ICD-10-CM | POA: Diagnosis not present

## 2016-01-08 ENCOUNTER — Other Ambulatory Visit: Payer: Self-pay | Admitting: Family Medicine

## 2016-01-08 NOTE — Telephone Encounter (Signed)
Ok to refill??  Last office visit 11/27/2015.  Last refill 11/08/2015.

## 2016-01-08 NOTE — Telephone Encounter (Signed)
okay

## 2016-01-09 NOTE — Telephone Encounter (Signed)
Medication called to pharmacy. 

## 2016-02-27 ENCOUNTER — Telehealth: Payer: Self-pay | Admitting: *Deleted

## 2016-02-27 MED ORDER — ALPRAZOLAM 0.5 MG PO TABS: 0.5000 mg | ORAL_TABLET | Freq: Two times a day (BID) | ORAL | 0 refills | Status: DC | PRN

## 2016-02-27 NOTE — Telephone Encounter (Signed)
Call placed to patient and patient made aware.  

## 2016-02-27 NOTE — Telephone Encounter (Signed)
Received fax requesting refill on Xanax.   Ok to refill??  Last office visit 01/09/2016.  Last refill 11/27/2015.

## 2016-02-27 NOTE — Telephone Encounter (Signed)
ok 

## 2016-03-18 ENCOUNTER — Emergency Department (HOSPITAL_COMMUNITY): Payer: Medicare HMO

## 2016-03-18 ENCOUNTER — Telehealth: Payer: Self-pay | Admitting: Family Medicine

## 2016-03-18 ENCOUNTER — Encounter (HOSPITAL_COMMUNITY): Payer: Self-pay

## 2016-03-18 ENCOUNTER — Emergency Department (HOSPITAL_COMMUNITY)
Admission: EM | Admit: 2016-03-18 | Discharge: 2016-03-18 | Disposition: A | Discharge status: Home / Self Care | Payer: Medicare HMO | Attending: Emergency Medicine | Admitting: Emergency Medicine

## 2016-03-18 DIAGNOSIS — R4781 Slurred speech: Secondary | ICD-10-CM | POA: Diagnosis not present

## 2016-03-18 DIAGNOSIS — R51 Headache: Secondary | ICD-10-CM | POA: Insufficient documentation

## 2016-03-18 DIAGNOSIS — I6789 Other cerebrovascular disease: Secondary | ICD-10-CM | POA: Diagnosis not present

## 2016-03-18 DIAGNOSIS — Z8673 Personal history of transient ischemic attack (TIA), and cerebral infarction without residual deficits: Secondary | ICD-10-CM | POA: Diagnosis not present

## 2016-03-18 DIAGNOSIS — Z5181 Encounter for therapeutic drug level monitoring: Secondary | ICD-10-CM | POA: Insufficient documentation

## 2016-03-18 DIAGNOSIS — R479 Unspecified speech disturbances: Secondary | ICD-10-CM | POA: Diagnosis not present

## 2016-03-18 DIAGNOSIS — Z7982 Long term (current) use of aspirin: Secondary | ICD-10-CM | POA: Diagnosis not present

## 2016-03-18 DIAGNOSIS — Z79899 Other long term (current) drug therapy: Secondary | ICD-10-CM | POA: Insufficient documentation

## 2016-03-18 DIAGNOSIS — R531 Weakness: Secondary | ICD-10-CM

## 2016-03-18 DIAGNOSIS — F449 Dissociative and conversion disorder, unspecified: Secondary | ICD-10-CM

## 2016-03-18 DIAGNOSIS — F17210 Nicotine dependence, cigarettes, uncomplicated: Secondary | ICD-10-CM | POA: Diagnosis not present

## 2016-03-18 DIAGNOSIS — R519 Headache, unspecified: Secondary | ICD-10-CM

## 2016-03-18 DIAGNOSIS — G43009 Migraine without aura, not intractable, without status migrainosus: Secondary | ICD-10-CM

## 2016-03-18 DIAGNOSIS — R69 Illness, unspecified: Secondary | ICD-10-CM | POA: Diagnosis not present

## 2016-03-18 DIAGNOSIS — R471 Dysarthria and anarthria: Secondary | ICD-10-CM | POA: Diagnosis not present

## 2016-03-18 LAB — DIFFERENTIAL
BASOS ABS: 0 10*3/uL (ref 0.0–0.1)
BASOS PCT: 0 %
EOS ABS: 0.1 10*3/uL (ref 0.0–0.7)
Eosinophils Relative: 1 %
LYMPHS ABS: 2.7 10*3/uL (ref 0.7–4.0)
Lymphocytes Relative: 33 %
Monocytes Absolute: 0.4 10*3/uL (ref 0.1–1.0)
Monocytes Relative: 4 %
NEUTROS ABS: 4.9 10*3/uL (ref 1.7–7.7)
NEUTROS PCT: 62 %

## 2016-03-18 LAB — CBC
HCT: 44.6 % (ref 36.0–46.0)
HEMOGLOBIN: 14.7 g/dL (ref 12.0–15.0)
MCH: 29.7 pg (ref 26.0–34.0)
MCHC: 33 g/dL (ref 30.0–36.0)
MCV: 90.1 fL (ref 78.0–100.0)
Platelets: 254 10*3/uL (ref 150–400)
RBC: 4.95 MIL/uL (ref 3.87–5.11)
RDW: 12.9 % (ref 11.5–15.5)
WBC: 8.1 10*3/uL (ref 4.0–10.5)

## 2016-03-18 LAB — COMPREHENSIVE METABOLIC PANEL
ALBUMIN: 4.3 g/dL (ref 3.5–5.0)
ALT: 13 U/L — AB (ref 14–54)
AST: 18 U/L (ref 15–41)
Alkaline Phosphatase: 59 U/L (ref 38–126)
Anion gap: 9 (ref 5–15)
BILIRUBIN TOTAL: 1.3 mg/dL — AB (ref 0.3–1.2)
BUN: 12 mg/dL (ref 6–20)
CHLORIDE: 105 mmol/L (ref 101–111)
CO2: 26 mmol/L (ref 22–32)
Calcium: 9.8 mg/dL (ref 8.9–10.3)
Creatinine, Ser: 1.01 mg/dL — ABNORMAL HIGH (ref 0.44–1.00)
GFR calc Af Amer: >60 mL/min (ref >60)
GFR calc non Af Amer: 57 mL/min — ABNORMAL LOW (ref >60)
GLUCOSE: 148 mg/dL — AB (ref 65–99)
POTASSIUM: 3.7 mmol/L (ref 3.5–5.1)
Sodium: 140 mmol/L (ref 135–145)
Total Protein: 7.4 g/dL (ref 6.5–8.1)

## 2016-03-18 LAB — TROPONIN I: Troponin I: <0.03 ng/mL (ref <0.03)

## 2016-03-18 LAB — URINALYSIS, ROUTINE W REFLEX MICROSCOPIC
BILIRUBIN URINE: NEGATIVE
Glucose, UA: NEGATIVE mg/dL
KETONES UR: NEGATIVE mg/dL
LEUKOCYTES UA: NEGATIVE
NITRITE: NEGATIVE
Protein, ur: NEGATIVE mg/dL
Specific Gravity, Urine: 1.003 — ABNORMAL LOW (ref 1.005–1.030)
pH: 7 (ref 5.0–8.0)

## 2016-03-18 LAB — PROTIME-INR
INR: 0.96
Prothrombin Time: 12.8 seconds (ref 11.4–15.2)

## 2016-03-18 LAB — APTT: APTT: 34 s (ref 24–36)

## 2016-03-18 LAB — I-STAT TROPONIN, ED: Troponin i, poc: 0.01 ng/mL (ref 0.00–0.08)

## 2016-03-18 LAB — I-STAT CHEM 8, ED
BUN: 16 mg/dL (ref 6–20)
CREATININE: 1 mg/dL (ref 0.44–1.00)
Calcium, Ion: 1.15 mmol/L (ref 1.15–1.40)
Chloride: 100 mmol/L — ABNORMAL LOW (ref 101–111)
Glucose, Bld: 148 mg/dL — ABNORMAL HIGH (ref 65–99)
HEMATOCRIT: 46 % (ref 36.0–46.0)
Hemoglobin: 15.6 g/dL — ABNORMAL HIGH (ref 12.0–15.0)
POTASSIUM: 3.8 mmol/L (ref 3.5–5.1)
Sodium: 143 mmol/L (ref 135–145)
TCO2: 29 mmol/L (ref 0–100)

## 2016-03-18 NOTE — ED Provider Notes (Signed)
[facility] DEPT Provider Note   CSN: [id number] Arrival date & time: 03/18/16  1322   An emergency department physician performed an initial assessment on this suspected stroke patient at 1323.  History   Chief Complaint Chief Complaint  Patient presents with  . Code Stroke    HPI Sonia Oliver is a 66 y.o. female.  HPI   Pt presents as Code Stroke for concern for speech changes.  Patient not sure when it started exactly as Sonia Oliver was talking to anyone in her home, but when Sonia Oliver talked to her daughter at 61 Sonia Oliver had altered speech. They called their physician and code stroke activated.  Patient reports headache also started suddenly and lasted approximately 10 minutes and began at some point after Sonia Oliver had talked with her daughter on the phone. Reports the headache has been coming and going, and at time of my exam, denies any significant pain. Reports that Sonia Oliver had similar headache one year ago when Sonia Oliver was admitted to [facility] for similar symptoms. Pt had MRI at that time which was WNL, had work up, thought to be psychiatric and was discharged with buspar and cholesterol medication.  Patient denies any other numbness, weakness, visual changes, dizziness, recent illness.  Past Medical History:  Diagnosis Date  . Cancer ([place]) 11/11/2009  . Deaf   . Hyperlipidemia   . Stroke [facility]) 04/19/2014    Patient Active Problem List   Diagnosis Date Noted  . Tinnitus 04/26/2015  . Chronic insomnia 08/01/2014  . Mild hyperlipidemia 08/01/2014  . Adjustment disorder with mixed anxiety and depressed mood 04/21/2014    Past Surgical History:  Procedure Laterality Date  . ABDOMINAL HYSTERECTOMY    . COLON SURGERY      OB History    Gravida Para Term Preterm AB Living   0 0 0 0 0     SAB TAB Ectopic Multiple Live Births   0 0 0           Home Medications    Prior to Admission medications   Medication Sig Start Date End Date Taking? Authorizing Provider  ALPRAZolam Duanne Moron) 0.5 MG  tablet Take 1 tablet (0.5 mg total) by mouth 2 (two) times daily as needed. for anxiety Patient taking differently: Take 0.5 mg by mouth at bedtime. for anxiety 02/27/16  Yes Susy Frizzle, MD  aspirin EC 81 MG tablet Take 81 mg by mouth daily.   Yes Historical Provider, MD  atorvastatin (LIPITOR) 20 MG tablet TAKE 1 TABLET BY MOUTH AT 6PM. 04/26/15  Yes Alycia Rossetti, MD  fexofenadine (ALLEGRA) 180 MG tablet Take 180 mg by mouth daily.   Yes Historical Provider, MD  naproxen (NAPROSYN) 500 MG tablet TAKE 1 TABLET(500 MG) BY MOUTH TWICE DAILY WITH A MEAL Patient not taking: Reported on 03/18/2016 11/27/15   Alycia Rossetti, MD    Family History Family History  Problem Relation Age of Onset  . Arthritis Mother   . Diabetes Mother   . Stroke Mother   . Early death Father   . Heart disease Father   . Arthritis Sister   . Hyperlipidemia Sister   . Hypertension Sister   . Early death Sister   . Depression Sister   . Drug abuse Sister   . Hyperlipidemia Brother   . Hypertension Brother   . Early death Brother   . Arthritis Sister   . Hyperlipidemia Sister   . Hypertension Sister   . Depression Sister   . Drug  abuse Sister   . Arthritis Sister   . Hyperlipidemia Sister   . Hypertension Sister   . Depression Sister   . Drug abuse Sister     Social History Social History  Substance Use Topics  . Smoking status: Current Every Day Smoker    Packs/day: 0.50    Types: Cigarettes  . Smokeless tobacco: Never Used  . Alcohol use No     Allergies   Codeine and Sulfa antibiotics   Review of Systems Review of Systems  Constitutional: Negative for fever.  HENT: Negative for sore throat.   Eyes: Negative for visual disturbance.  Respiratory: Negative for cough and shortness of breath.   Cardiovascular: Negative for chest pain.  Gastrointestinal: Negative for abdominal pain.  Genitourinary: Negative for difficulty urinating.  Musculoskeletal: Negative for back pain and neck  pain.  Skin: Negative for rash.  Neurological: Positive for speech difficulty and headaches. Negative for dizziness, syncope, facial asymmetry, weakness and numbness.     Physical Exam Updated Vital Signs BP 152/94   Pulse (!) 55   Temp 98.5 F (36.9 C)   Resp 16   Wt 134 lb 0.6 oz (60.8 kg)   SpO2 98%   BMI 23.01 kg/m   Physical Exam  Constitutional: Sonia Oliver is oriented to person, place, and time. Sonia Oliver appears well-developed and well-nourished. No distress.  HENT:  Head: Normocephalic and atraumatic.  Eyes: Conjunctivae and EOM are normal.  Normal visual fields  Neck: Normal range of motion.  Cardiovascular: Normal rate, regular rhythm, normal heart sounds and intact distal pulses.  Exam reveals no gallop and no friction rub.   No murmur heard. Pulmonary/Chest: Effort normal and breath sounds normal. No respiratory distress. Sonia Oliver has no wheezes. Sonia Oliver has no rales.  Abdominal: Soft. Sonia Oliver exhibits no distension. There is no tenderness. There is no guarding.  Musculoskeletal: Sonia Oliver exhibits no edema or tenderness.  Neurological: Sonia Oliver is alert and oriented to person, place, and time. Sonia Oliver has normal strength. No cranial nerve deficit. Sensory deficit: pt reports right facial numbness, and left shoulder numbness on exam, distal left arm normal, normal lower ext sensation. Coordination (slowly performs finger to nose but normal coordination) normal. GCS eye subscore is 4. GCS verbal subscore is 5. GCS motor subscore is 6.  With pronator drift, holds both arms up, no pronating, does slightly drift with both arms  Skin: Skin is warm and dry. No rash noted. Sonia Oliver is not diaphoretic. No erythema.  Nursing note and vitals reviewed.    ED Treatments / Results  Labs (all labs ordered are listed, but only abnormal results are displayed) Labs Reviewed  COMPREHENSIVE METABOLIC PANEL - Abnormal; Notable for the following:       Result Value   Glucose, Bld 148 (*)    Creatinine, Ser 1.01 (*)    ALT 13  (*)    Total Bilirubin 1.3 (*)    GFR calc non Af Amer 57 (*)    All other components within normal limits  URINALYSIS, ROUTINE W REFLEX MICROSCOPIC - Abnormal; Notable for the following:    Color, Urine STRAW (*)    Specific Gravity, Urine 1.003 (*)    Hgb urine dipstick MODERATE (*)    Bacteria, UA RARE (*)    Squamous Epithelial / LPF 0-5 (*)    All other components within normal limits  I-STAT CHEM 8, ED - Abnormal; Notable for the following:    Chloride 100 (*)    Glucose, Bld 148 (*)  Hemoglobin 15.6 (*)    All other components within normal limits  PROTIME-INR  APTT  CBC  DIFFERENTIAL  TROPONIN I  I-STAT TROPOININ, ED  CBG MONITORING, ED    EKG  EKG Interpretation  Date/Time:  Wednesday March 18 2016 13:59:14 EST Ventricular Rate:  52 PR Interval:    QRS Duration: 84 QT Interval:  443 QTC Calculation: 412 R Axis:   47 Text Interpretation:  Sinus rhythm Probable anteroseptal infarct, old No significant change since last tracing Confirmed by [facility] MD, Bernie Fobes ([id number]) on 03/18/2016 9:57:05 PM       Radiology Ct Head Code Stroke W/o Cm  Addendum Date: 03/18/2016   ADDENDUM REPORT: 03/18/2016 14:06 ADDENDUM: Critical Value/emergent results were called by telephone at the time of interpretation on 03/18/2016 at 2:00 pm to Dr. Shon Hale, Neurology, who verbally acknowledged these results. Electronically Signed   By: Elon Alas M.D.   On: 03/18/2016 14:06   Result Date: 03/18/2016 CLINICAL DATA:  Code stroke. Acute onset headache and slurred speech. EXAM: CT HEAD WITHOUT CONTRAST TECHNIQUE: Contiguous axial images were obtained from the base of the skull through the vertex without intravenous contrast. COMPARISON:  None. FINDINGS: BRAIN: The ventricles and sulci are normal for age. No intraparenchymal hemorrhage, mass effect nor midline shift. Patchy supratentorial white matter hypodensities within normal range for patient's age, though non-specific are most  compatible with chronic small vessel ischemic disease. No acute large vascular territory infarcts. No abnormal extra-axial fluid collections. Basal cisterns are patent. VASCULAR: Mild calcific atherosclerosis of the carotid siphons. SKULL: No skull fracture. No significant scalp soft tissue swelling. SINUSES/ORBITS: Frothy secretions RIGHT sphenoid sinus. Small LEFT sphenoid mucosal retention cyst. Mastoid air cells are well aerated. The included ocular globes and orbital contents are non-suspicious. OTHER: None. ASPECTS [facility] Stroke Program Early CT Score) - Ganglionic level infarction (caudate, lentiform nuclei, internal capsule, insula, M1-M3 cortex): 7 - Supraganglionic infarction (M4-M6 cortex): 3 Total score (0-10 with 10 being normal): 10 IMPRESSION: 1. No acute intracranial process; negative CT HEAD for age. 2. ASPECTS is 10. Dr. Shon Hale paged on March 18, 2006 at 1340 hours, awaiting return call. Electronically Signed: By: Elon Alas M.D. On: 03/18/2016 13:55    Procedures Procedures (including critical care time)  Medications Ordered in ED Medications - No data to display   Initial Impression / Assessment and Plan / ED Course  I have reviewed the triage vital signs and the nursing notes.  Pertinent labs & imaging results that were available during my care of the patient were reviewed by me and considered in my medical decision making (see chart for details).     66yo female with history of hyperlipidemia, deafness, colon cancer, prior episode of dysarthria with work up/MRI in 04/19/2014 which showed no acute abnormalities, presents with concern for speech abnormality and headache.  Patient presented as Code Stroke, and Dr. Shon Hale was at bedside immediately evaluating patient and discussing results and evaluation with family.  Exam inconsistent between Neurology and myself and not consistent with focal lesion on my exam, with bilateral drift with no pronation, sensory deficit  contralateral face and shoulder.  Headache similar to prior, improved spontaneously at time of my evaluation, and patient declines headache medications. Doubt SAH and now sign of aneurysm on prior MRA.  Other labs, ECG, urinalysis not consistent with acute abnormalities.  Neurology suspects conversion disorder given history, physical. Family also states pt under stress as possile trigger. Discussed results with pt and family and recommend supportive care,  continued home medications.   Final Clinical Impressions(s) / ED Diagnoses   Final diagnoses:  Nonintractable headache, unspecified chronicity pattern, unspecified headache type  Speech disturbance, unspecified type    New Prescriptions Discharge Medication List as of 03/18/2016  4:01 PM       Gareth Morgan, MD 03/18/16 2211

## 2016-03-18 NOTE — Code Documentation (Signed)
66 year old female presents to [facility] via [place] as code stroke.  Patinet was LSW at 1220 - at 21 her daughter called her and noted some slurred speech.  MD office as contacted who called 911 for evaluation.   EMS reports inconsistent exam with extremities - resist following commands but moves normally when distracted. Dr. Shon Hale at bedside.   In  ED patient was noted to have inconsistent dysarthria - would not moved to command  - bil arms and legs were equally drifting -exhibits some resistance to movement when examiner attempted to assist.NIHHS 5 - see notes.  She complained only of left sided headache.  After exam patient became tearful stating some recent stressors. Family arrived and confirmed.  Code stroke was cancelled by Dr. Shon Hale.  Handoff to [employer].

## 2016-03-18 NOTE — Telephone Encounter (Signed)
Pt called, says something is wrong with her.  Sudden onset of mumbled speech and facial dropping.  Had some dizziness earlier.  Told patient she may be having a stroke.  She is home alone.  Told patient needs to go to hospital immediately.  Told her stay calm where she is in the house and that I will be calling 911 for her since her speech garbled.  She agrees.  911 called and sent the house.

## 2016-03-18 NOTE — ED Triage Notes (Signed)
Per EMS - pt from home. Sudden onset of headache and slurred speech at 1225. Pt is deaf in left ear and has baseline slurred speech; however, pt states speech is more slurred than baseline at this time. No other complaints at this time from pt. Hx TIA

## 2016-03-18 NOTE — Telephone Encounter (Signed)
Noted pt in ED for workup

## 2016-03-18 NOTE — Consult Note (Signed)
Neurology Consult Note  Reason for Consultation: CODE STROKE  Requesting provider: EMS; ED MD [place]  CC: Trouble talking, headache  HPI: This is a 11-yo RH woman who is brought to the ED by EMS as a CODE STROKE. History is obtained from the EMTs who brought her in as well as from the patient herself. I also spoke with the patient's husband and daughter at the bedside after their arrival in the ED.   The patient reports that she was in her usual state of health this morning. At about 1225 she developed a headache and noticed that her speech seemed different. Her daughter states that she called her mother this morning but she did not answer initially. When she called her back a little bit later, she noticed that her mother's speech was a little slurred. The patient apparently called her doctor and 911 was activated. On their arrival, the patient c/o severe L sided headache and slurred speech. No lateralizing deficits were appreciated but the EMT stated that the patient told her she could not move her arms. However, she was observed to move them normally when distracted.   I met the patient on her arrival in the ED with the stroke team. She continued to c/o severe L headache and trouble speaking. She was taken for an emergent CT of the head which was unremarkable. On exam, she had inconsistent dysarthria. She would not move her arms or legs to command during the exam but was noted to move them well during other parts of the exam. In addition, she would actively contract antagonist muscles during confrontational testing. While I was talking to her, she became tearful and started telling me about a sister who died of a drug overdose and another sister who uses drugs. When I mentioned this to her daughter, she noted that the patient spoke with her sister on the phone this morning before her symptoms started. The patient's symptoms were felt to be non-physiologic in nature and the CODE STROKE was canceled.    The patient was admitted to [facility] from 04/19/14-04/21/14 for the same symptoms. At that time she was admitted for a stroke evaluation which was unrevealing. It was felt that her speech changes were psychogenic in nature. A psychiatry consultation was obtained and she was diagnosed with adjustment disorder with mixed anxiety and depressed mood. Buspar 10 mg BID was prescribed for anxiety and she was discharged home.   Last known well: 1225 today  tPA given?:  No, presentation not felt to be consistent with stroke   PMH:  Past Medical History:  Diagnosis Date  . Cancer ([place]) 11/11/2009  . Deaf   . Hyperlipidemia   . Stroke [facility]) 04/19/2014    PSH:  Past Surgical History:  Procedure Laterality Date  . ABDOMINAL HYSTERECTOMY    . COLON SURGERY      Family history: Family History  Problem Relation Age of Onset  . Arthritis Mother   . Diabetes Mother   . Stroke Mother   . Early death Father   . Heart disease Father   . Arthritis Sister   . Hyperlipidemia Sister   . Hypertension Sister   . Early death Sister   . Depression Sister   . Drug abuse Sister   . Hyperlipidemia Brother   . Hypertension Brother   . Early death Brother   . Arthritis Sister   . Hyperlipidemia Sister   . Hypertension Sister   . Depression Sister   . Drug abuse  Sister   . Arthritis Sister   . Hyperlipidemia Sister   . Hypertension Sister   . Depression Sister   . Drug abuse Sister     Social history:  Social History   Social History  . Marital status: Married    Spouse name: N/A  . Number of children: N/A  . Years of education: N/A   Occupational History  . Not on file.   Social History Main Topics  . Smoking status: Current Every Day Smoker    Packs/day: 0.50    Types: Cigarettes  . Smokeless tobacco: Never Used  . Alcohol use No  . Drug use: No  . Sexual activity: Yes    Birth control/ protection: Surgical   Other Topics Concern  . Not on file   Social History  Narrative   ** Merged History Encounter **        Current outpatient meds: No outpatient prescriptions have been marked as taking for the 03/18/16 encounter [facility] Encounter).    Current inpatient meds:  No current facility-administered medications for this encounter.    Current Outpatient Prescriptions  Medication Sig Dispense Refill  . ALPRAZolam (XANAX) 0.5 MG tablet Take 1 tablet (0.5 mg total) by mouth 2 (two) times daily as needed. for anxiety 45 tablet 0  . aspirin EC 81 MG tablet Take 81 mg by mouth daily.    Marland Kitchen atorvastatin (LIPITOR) 20 MG tablet TAKE 1 TABLET BY MOUTH AT 6PM. 90 tablet 2  . fexofenadine (ALLEGRA) 180 MG tablet Take 180 mg by mouth daily.    . naproxen (NAPROSYN) 500 MG tablet TAKE 1 TABLET(500 MG) BY MOUTH TWICE DAILY WITH A MEAL 180 tablet 1    Allergies: Allergies  Allergen Reactions  . Codeine Shortness Of Breath  . Sulfa Antibiotics Nausea Only    ROS: As per HPI. A full 14-point review of systems was performed and is otherwise notable for a fast heartrate and some shortness of breath earlier today. She reports some nausea, photophobia and phonophobia. It is otherwise unremarkable.   PE:  BP (!) 166/109 (BP Location: Right Arm)   Pulse 72   Temp 98.5 F (36.9 C) (Oral)   Resp 15   Wt 60.8 kg (134 lb 0.6 oz)   SpO2 97%   BMI 23.01 kg/m   General: WDWN, no acute distress. AAO x4. Speech with moderate dysarthria. No aphasia. Follows commands briskly. Affect is anxious and at times tearful. Comportment is normal.  HEENT: Normocephalic. Neck supple without LAD. MMM, OP clear. Dentition good. Sclerae anicteric. No conjunctival injection.  CV: Regular, no murmur. Carotid pulses full and symmetric, no bruits. Distal pulses 2+ and symmetric.  Lungs: CTAB.  Abdomen: Soft, non-distended, non-tender. Bowel sounds present x4.  Extremities: No C/C/E. Neuro:  CN: Pupils are equal and round. They are symmetrically reactive from 3-->2 mm. Visual fields  are full to confrontation. EOMI without nystagmus. No reported diplopia. Facial sensation is intact to light touch. Face is symmetric at rest with normal strength and mobility. She states that she is unable to hear from her L ear. Palate elevates symmetrically and uvula is midline. Voice is normal in tone, pitch and quality. Bilateral SCM and trapezii are 5/5. Tongue is midline with normal bulk and mobility.  Motor: Normal bulk, tone. During confrontational testing, she will contract antagonistic muscles. She has pronounced giveway. Despite this she is observed to use both arms and legs to shift herself in bed without any difficulty. No tremor or other  abnormal movements. No drift.  Sensation: Intact to light touch and pinprick.  DTRs: 2+, symmetric. Toes downgoing bilaterally. No pathologic reflexes.  Coordination: Finger-to-nose and heel-to-shin are without dysmetria. These were performed very deliberately despite being unable to lift her arms or legs of the bed when asked during strength testing. Finger taps are normal in amplitude and speed, no decrement.    Labs:  Lab Results  Component Value Date   WBC 8.1 03/18/2016   HGB 14.7 03/18/2016   HGB 15.6 (H) 03/18/2016   HCT 44.6 03/18/2016   HCT 46.0 03/18/2016   PLT 254 03/18/2016   GLUCOSE 148 (H) 03/18/2016   GLUCOSE 148 (H) 03/18/2016   CHOL 131 03/01/2015   TRIG 121 03/01/2015   HDL 50 03/01/2015   LDLCALC 57 03/01/2015   ALT 13 (L) 03/18/2016   AST 18 03/18/2016   NA 140 03/18/2016   NA 143 03/18/2016   K 3.7 03/18/2016   K 3.8 03/18/2016   CL 105 03/18/2016   CL 100 (L) 03/18/2016   CREATININE 1.01 (H) 03/18/2016   CREATININE 1.00 03/18/2016   BUN 12 03/18/2016   BUN 16 03/18/2016   CO2 26 03/18/2016   TSH 2.626 03/01/2015   INR 0.96 03/18/2016   HGBA1C 6.1 (H) 04/20/2014    Imaging:  I have personally and independently reviewed the [facility] without contrast from today. This is unremarkable. This was discussed with the  interpreting radiologist at the time of consultation.   Assessment and Plan:  1. Probable conversion disorder: Her exam is non-physiologic in nature. She denies any stressors but then became tearful and anxious during the exam, talking about her sister's drug addiction. She had a similar presentation in 03/2014 which was also felt to be psychogenic in nature. No further workup is recommended at this time. I discussed this with the patient and her family and they verbalize understanding. Her family reports that she is supposed to be taking medication for her anxiety but does not take it. The importance of stress recognition and management are important.   2. Dysarthria: She has some inconsistent dysarthria on exam that is likely nonphysiologic in nature. Supportive care.   3. Weakness: She did not move her extremities when asked but has normal strength on observation and when distracted. This is non-physiologic. Supportive care.   4. Headache: She reports a severe L sided headache with associated nausea, photophobia, and phonophobia. This is most consistent with migraine with aura. Would treat accordingly with IV fluids, antiemetics as needed.   This was discussed with the patient, her husband, and her daughter at length. They were given the opportunity to ask any questions and these were addressed to their satisfaction.   I discussed my impression and recommendations with ED attending Dr. Billy Fischer. If her headache improves she can likely be d/c'd home to f/u with PCP. I have no additional recs and will sign off. Please call if any new issues arise.

## 2016-03-20 ENCOUNTER — Encounter: Payer: Self-pay | Admitting: Family Medicine

## 2016-03-20 ENCOUNTER — Ambulatory Visit (INDEPENDENT_AMBULATORY_CARE_PROVIDER_SITE_OTHER): Payer: Medicare HMO | Admitting: Family Medicine

## 2016-03-20 DIAGNOSIS — M1611 Unilateral primary osteoarthritis, right hip: Secondary | ICD-10-CM | POA: Diagnosis not present

## 2016-03-20 DIAGNOSIS — F449 Dissociative and conversion disorder, unspecified: Secondary | ICD-10-CM | POA: Diagnosis not present

## 2016-03-20 DIAGNOSIS — R69 Illness, unspecified: Secondary | ICD-10-CM | POA: Diagnosis not present

## 2016-03-20 DIAGNOSIS — M169 Osteoarthritis of hip, unspecified: Secondary | ICD-10-CM | POA: Insufficient documentation

## 2016-03-20 MED ORDER — ALPRAZOLAM 0.5 MG PO TABS: 0.5000 mg | ORAL_TABLET | Freq: Two times a day (BID) | ORAL | 0 refills | Status: DC | PRN

## 2016-03-20 MED ORDER — ESCITALOPRAM OXALATE 5 MG PO TABS: 5.0000 mg | ORAL_TABLET | Freq: Every day | ORAL | 2 refills | Status: DC

## 2016-03-20 NOTE — Patient Instructions (Signed)
Take the lexapro at bedtime Continue the Xanax as needed Referral to orthopedics  F/U 4 weeks

## 2016-03-20 NOTE — Assessment & Plan Note (Signed)
Concern that her anxiety/mental state is causing her symptoms,stroke work up x 2 neg, she admits her nerve pills make her talk normal Exam very inconsisent with speech today as well She declines therapy Plan to start lexapro 5mg  at bedtime, she agrees to this, though very concerned about SE, which is why she never took prescribed meds in past per report Continue the xanax.  F/U 4 weeks

## 2016-03-20 NOTE — Progress Notes (Signed)
Subjective:    Patient ID: Sonia Oliver, female    DOB: August 27, 1950, 66 y.o.   MRN: [id number]  Patient presents for ER F/U (slurred speech) Here for emergency room follow-up. She called the office on January 24 complaining of slurred speech and headache there was concern for possible stroke. EMS was activated however they're right below her exam was inconsistent with regards to not moving her upper extremities and her speech. Code stroke was continued she was seen in the emergency room consultation by neurology at the bedside. Her exam was very inconsistent with her dysarthria and her inability to move her upper extremities. Her CT scan was negative. This was thought to be conversion disorder. Family member states that she had been under some stress she recently talked to her sister who is on drugs and that may have upset her. Of note she been admitted back in February with similar code stroke symptoms everything was negative psychiatry was called diagnosis at that time as well as psychosomatic and she was prescribed BuSpar and discharged home. Her current anxiolytic is Xanax as needed which she does not take daily. She is also on statin drug.  Today she states that her husband became upset with her last night when her speech was slurring telling her to stop faking,she states she knows her anxiety is causing it, it was explained by the neurologist but that she wasn't doing it on purpose. She took a xanax and her speech cleared up. She has some other family members who take depression/anxiet medicines as well, her brother would  Have problems with swallowing when his anxiety was bad   Has OA in hip   Review Of Systems:  GEN- denies fatigue, fever, weight loss,weakness, recent illness HEENT- denies eye drainage, change in vision, nasal discharge, CVS- denies chest pain, palpitations RESP- denies SOB, cough, wheeze ABD- denies N/V, change in stools, abd pain GU- denies dysuria, hematuria,  dribbling, incontinence MSK- denies joint pain, muscle aches, injury Neuro- denies headache, dizziness, syncope, seizure activity       Objective:    BP 132/78 (BP Location: Right Arm, Patient Position: Sitting, Cuff Size: Normal)   Pulse 60   Temp 98.4 F (36.9 C) (Oral)   Resp 14   Ht 5\' 4"  (1.626 m)   Wt 131 lb (59.4 kg)   SpO2 98%   BMI 22.49 kg/m  GEN- NAD, alert and oriented x3 HEENT- PERRL, EOMI, non injected sclera, pink conjunctiva, MMM, oropharynx clear CVS- RRR, no murmur RESP-CTAB Psych- anxious appearing, tearful during conversation, no SI, speech slurred randomly depending on the words  Neuro-CNII-XII in tact, no deficits otherwise         Assessment & Plan:      Problem List Items Addressed This Visit    OA (osteoarthritis) of hip   Relevant Orders   Ambulatory referral to Orthopedic Surgery   Conversion disorder    Concern that her anxiety/mental state is causing her symptoms,stroke work up x 2 neg, she admits her nerve pills make her talk normal Exam very inconsisent with speech today as well She declines therapy Plan to start lexapro 5mg  at bedtime, she agrees to this, though very concerned about SE, which is why she never took prescribed meds in past per report Continue the xanax.  F/U 4 weeks          Note: This dictation was prepared with Dragon dictation along with smaller phrase technology. Any transcriptional errors that result from this process  are unintentional.

## 2016-03-23 DIAGNOSIS — R29818 Other symptoms and signs involving the nervous system: Secondary | ICD-10-CM | POA: Diagnosis not present

## 2016-04-02 ENCOUNTER — Other Ambulatory Visit: Payer: Self-pay | Admitting: *Deleted

## 2016-04-02 MED ORDER — ATORVASTATIN CALCIUM 20 MG PO TABS: ORAL_TABLET | ORAL | 2 refills | Status: DC

## 2016-04-13 ENCOUNTER — Encounter: Payer: Self-pay | Admitting: Orthopedic Surgery

## 2016-04-13 ENCOUNTER — Ambulatory Visit (INDEPENDENT_AMBULATORY_CARE_PROVIDER_SITE_OTHER): Payer: Medicare HMO | Admitting: Orthopedic Surgery

## 2016-04-13 VITALS — BP 135/73 | HR 59 | Ht 66.0 in | Wt 130.0 lb

## 2016-04-13 DIAGNOSIS — M545 Low back pain: Secondary | ICD-10-CM

## 2016-04-13 DIAGNOSIS — G8929 Other chronic pain: Secondary | ICD-10-CM | POA: Diagnosis not present

## 2016-04-13 NOTE — Progress Notes (Signed)
Patient ID: Sonia Oliver, female   DOB: [birth year], 66 y.o.   MRN: [id number]  Chief Complaint  Patient presents with  . Hip Pain    Right hip pain, referred by Dr. Buelah Manis. [facility] Brunette at [facility].    HPI Sonia Oliver is a 66 y.o. female.  Presents for evaluation of right hip pain  The patient was in her normal state of health. She received a pneumococcal vaccine a year ago and then started having groin and anterior thigh pain. She was sent for x-rays and the x-ray showed mild arthritis of both hips and the lower back  The pain is constant, is worse with walking, it radiates into the right thigh down to but not below the knee  Review of Systems Review of Systems  Constitutional: Negative for fever and unexpected weight change.  Gastrointestinal: Negative.   Genitourinary: Negative.   Neurological: Negative for numbness.   (2 MINIMUM)  Past Medical History:  Diagnosis Date  . Cancer ([place]) 11/11/2009  . Deaf   . Hyperlipidemia   . Stroke [facility]) 04/19/2014    Past Surgical History:  Procedure Laterality Date  . ABDOMINAL HYSTERECTOMY    . COLON SURGERY      Social History Social History  Substance Use Topics  . Smoking status: Current Every Day Smoker    Packs/day: 0.50    Types: Cigarettes  . Smokeless tobacco: Never Used  . Alcohol use No    Allergies  Allergen Reactions  . Codeine Shortness Of Breath  . Sulfa Antibiotics Nausea Only    No outpatient prescriptions have been marked as taking for the 04/13/16 encounter (Office Visit) with Carole Civil, MD.      Physical Exam Physical Exam 1.BP 135/73   Pulse (!) 59   Ht 5\' 6"  (1.676 m)   Wt 130 lb (59 kg)   BMI 20.98 kg/m   2. Gen. appearance. The patient is well-developed and well-nourished, grooming and hygiene are normal. There are no gross congenital abnormalities  3. The patient is alert and oriented to person place and time  4. Mood and affect are normal  5. Ambulation Normal heel-toe  gait  Examination reveals the following: 6. On inspection we find no tenderness in the groin or right or left greater trochanter, tenderness in the last 2 segments of the lumbar spine left and right sides of the lumbar spine right greater than left  7. With the range of motion of  the right and left hip 125 with normal passive internal and external rotation without pain in the right and left hip  8. Stability tests were normal  in each hip  9. Strength tests revealed grade 5 motor strength in the right and left leg  10. Skin we find no rash ulceration or erythema right and left leg  11. Sensation remains intact right and left lower extremity  12 Vascular system shows no peripheral edema right and left ankle    MEDICAL DECISION MAKING:    Data Reviewed Plain films were done prior and the reports were read as mild arthritis back in both hips  The hips look fine to me there is some degenerative disc disease in the lower back  My independent interpretation of the AP pelvis and hip x-ray are normal hip and pelvis  Assessment Encounter Diagnosis  Name Primary?  . Chronic bilateral low back pain, with sciatica presence unspecified Yes     Plan Recommend anti-inflammatories as prescribed by Dr. Sydell Axon if  no improvement she is welcome to come back for reevaluation  Arther Abbott 04/13/2016, 11:54 AM

## 2016-04-13 NOTE — Patient Instructions (Signed)
TAKE MEDICATION PRESCRIBED BY DR Buelah Manis

## 2016-04-17 ENCOUNTER — Encounter: Payer: Self-pay | Admitting: Family Medicine

## 2016-04-17 ENCOUNTER — Ambulatory Visit (INDEPENDENT_AMBULATORY_CARE_PROVIDER_SITE_OTHER): Payer: Medicare HMO | Admitting: Family Medicine

## 2016-04-17 VITALS — BP 130/74 | HR 55 | Temp 98.7°F | Resp 14 | Wt 134.6 lb

## 2016-04-17 DIAGNOSIS — F5104 Psychophysiologic insomnia: Secondary | ICD-10-CM | POA: Diagnosis not present

## 2016-04-17 DIAGNOSIS — F32A Depression, unspecified: Secondary | ICD-10-CM

## 2016-04-17 DIAGNOSIS — F419 Anxiety disorder, unspecified: Principal | ICD-10-CM

## 2016-04-17 DIAGNOSIS — E785 Hyperlipidemia, unspecified: Secondary | ICD-10-CM | POA: Diagnosis not present

## 2016-04-17 DIAGNOSIS — F329 Major depressive disorder, single episode, unspecified: Secondary | ICD-10-CM

## 2016-04-17 DIAGNOSIS — F418 Other specified anxiety disorders: Secondary | ICD-10-CM | POA: Diagnosis not present

## 2016-04-17 DIAGNOSIS — R69 Illness, unspecified: Secondary | ICD-10-CM | POA: Diagnosis not present

## 2016-04-17 MED ORDER — ESCITALOPRAM OXALATE 10 MG PO TABS: 10.0000 mg | ORAL_TABLET | Freq: Every day | ORAL | 3 refills | Status: DC

## 2016-04-17 NOTE — Patient Instructions (Signed)
F/U As previous for Physical Call ahead of time for your lab appointment

## 2016-04-17 NOTE — Progress Notes (Signed)
   Subjective:    Patient ID: Sonia Oliver, female    DOB: 29-Dec-1950, 66 y.o.   MRN: [id number]  Patient presents for Hyperlipidemia (4 week f/u)   Pt here to follow up anxiety/depression at last visit started on lexapro , her speech is good, but still feels jittery during the day, and still feels depressed at times. Husband refused to accompany her today on visit. NO Side effects with the medication   Due for lipid check       Review Of Systems:  GEN- denies fatigue, fever, weight loss,weakness, recent illness HEENT- denies eye drainage, change in vision, nasal discharge, CVS- denies chest pain, palpitations RESP- denies SOB, cough, wheeze ABD- denies N/V, change in stools, abd pain GU- denies dysuria, hematuria, dribbling, incontinence MSK- denies joint pain, muscle aches, injury Neuro- denies headache, dizziness, syncope, seizure activity       Objective:    BP 130/74   Pulse (!) 55   Temp 98.7 F (37.1 C) (Oral)   Resp 14   Wt 134 lb 9.6 oz (61.1 kg)   SpO2 98%   BMI 21.73 kg/m  GEN- NAD, alert and oriented x3 Psych- normal affect and mood, not anxious appearing, speech is normal, no SI        Assessment & Plan:      Problem List Items Addressed This Visit    Mild hyperlipidemia    Check lipids, on statin drug       Chronic insomnia   Anxiety and depression - Primary    Improving with lexapro, but still some uncontrolled anxiety Increase to lexapro 10mg  at bedtime Continue xanax          Note: This dictation was prepared with Dragon dictation along with smaller phrase technology. Any transcriptional errors that result from this process are unintentional.

## 2016-04-18 ENCOUNTER — Encounter: Payer: Self-pay | Admitting: Family Medicine

## 2016-04-18 NOTE — Assessment & Plan Note (Signed)
Check lipids, on statin drug 

## 2016-04-18 NOTE — Assessment & Plan Note (Signed)
Improving with lexapro, but still some uncontrolled anxiety Increase to lexapro 10mg  at bedtime Continue xanax

## 2016-04-23 ENCOUNTER — Other Ambulatory Visit: Payer: Medicare HMO

## 2016-04-23 ENCOUNTER — Other Ambulatory Visit: Payer: Self-pay | Admitting: Family Medicine

## 2016-04-23 DIAGNOSIS — F329 Major depressive disorder, single episode, unspecified: Secondary | ICD-10-CM

## 2016-04-23 DIAGNOSIS — F32A Depression, unspecified: Secondary | ICD-10-CM

## 2016-04-23 DIAGNOSIS — Z79899 Other long term (current) drug therapy: Secondary | ICD-10-CM

## 2016-04-23 DIAGNOSIS — Z0000 Encounter for general adult medical examination without abnormal findings: Secondary | ICD-10-CM | POA: Diagnosis not present

## 2016-04-23 DIAGNOSIS — F419 Anxiety disorder, unspecified: Principal | ICD-10-CM

## 2016-04-23 DIAGNOSIS — E785 Hyperlipidemia, unspecified: Secondary | ICD-10-CM

## 2016-04-23 DIAGNOSIS — M8589 Other specified disorders of bone density and structure, multiple sites: Secondary | ICD-10-CM

## 2016-04-23 DIAGNOSIS — R69 Illness, unspecified: Secondary | ICD-10-CM | POA: Diagnosis not present

## 2016-04-23 LAB — CBC WITH DIFFERENTIAL/PLATELET
BASOS ABS: 61 {cells}/uL (ref 0–200)
BASOS PCT: 1 %
EOS ABS: 122 {cells}/uL (ref 15–500)
Eosinophils Relative: 2 %
HEMATOCRIT: 40.6 % (ref 35.0–45.0)
HEMOGLOBIN: 13.3 g/dL (ref 12.0–15.0)
Lymphocytes Relative: 31 %
Lymphs Abs: 1891 cells/uL (ref 850–3900)
MCH: 29 pg (ref 27.0–33.0)
MCHC: 32.8 g/dL (ref 32.0–36.0)
MCV: 88.6 fL (ref 80.0–100.0)
MONO ABS: 305 {cells}/uL (ref 200–950)
MONOS PCT: 5 %
MPV: 10.1 fL (ref 7.5–12.5)
NEUTROS ABS: 3721 {cells}/uL (ref 1500–7800)
Neutrophils Relative %: 61 %
PLATELETS: 224 10*3/uL (ref 140–400)
RBC: 4.58 MIL/uL (ref 3.80–5.10)
RDW: 13.7 % (ref 11.0–15.0)
WBC: 6.1 10*3/uL (ref 3.8–10.8)

## 2016-04-23 LAB — COMPLETE METABOLIC PANEL WITH GFR
ALK PHOS: 49 U/L (ref 33–130)
ALT: 12 U/L (ref 6–29)
AST: 12 U/L (ref 10–35)
Albumin: 4 g/dL (ref 3.6–5.1)
BILIRUBIN TOTAL: 0.5 mg/dL (ref 0.2–1.2)
BUN: 14 mg/dL (ref 7–25)
CALCIUM: 9 mg/dL (ref 8.6–10.4)
CO2: 28 mmol/L (ref 20–31)
CREATININE: 1 mg/dL — AB (ref 0.50–0.99)
Chloride: 110 mmol/L (ref 98–110)
GFR, EST AFRICAN AMERICAN: 68 mL/min (ref >=60)
GFR, EST NON AFRICAN AMERICAN: 59 mL/min — AB (ref >=60)
Glucose, Bld: 92 mg/dL (ref 70–99)
Potassium: 4.1 mmol/L (ref 3.5–5.3)
Sodium: 144 mmol/L (ref 135–146)
TOTAL PROTEIN: 6.2 g/dL (ref 6.1–8.1)

## 2016-04-23 LAB — LIPID PANEL
CHOLESTEROL: 120 mg/dL (ref <200)
HDL: 48 mg/dL — ABNORMAL LOW (ref >50)
LDL Cholesterol: 52 mg/dL (ref <100)
TRIGLYCERIDES: 98 mg/dL (ref <150)
Total CHOL/HDL Ratio: 2.5 Ratio (ref <5.0)
VLDL: 20 mg/dL (ref <30)

## 2016-04-23 LAB — TSH: TSH: 3.58 m[IU]/L

## 2016-04-24 LAB — VITAMIN D 25 HYDROXY (VIT D DEFICIENCY, FRACTURES): Vit D, 25-Hydroxy: 34 ng/mL (ref 30–100)

## 2016-04-27 ENCOUNTER — Ambulatory Visit (INDEPENDENT_AMBULATORY_CARE_PROVIDER_SITE_OTHER): Payer: Medicare HMO | Admitting: Family Medicine

## 2016-04-27 ENCOUNTER — Encounter: Payer: Self-pay | Admitting: Family Medicine

## 2016-04-27 VITALS — BP 120/72 | HR 58 | Temp 98.3°F | Resp 14 | Ht 66.0 in | Wt 133.0 lb

## 2016-04-27 DIAGNOSIS — F329 Major depressive disorder, single episode, unspecified: Secondary | ICD-10-CM

## 2016-04-27 DIAGNOSIS — Z23 Encounter for immunization: Secondary | ICD-10-CM

## 2016-04-27 DIAGNOSIS — F419 Anxiety disorder, unspecified: Secondary | ICD-10-CM

## 2016-04-27 DIAGNOSIS — Z0000 Encounter for general adult medical examination without abnormal findings: Secondary | ICD-10-CM | POA: Diagnosis not present

## 2016-04-27 DIAGNOSIS — F5104 Psychophysiologic insomnia: Secondary | ICD-10-CM

## 2016-04-27 DIAGNOSIS — F418 Other specified anxiety disorders: Secondary | ICD-10-CM

## 2016-04-27 DIAGNOSIS — R69 Illness, unspecified: Secondary | ICD-10-CM | POA: Diagnosis not present

## 2016-04-27 DIAGNOSIS — F32A Depression, unspecified: Secondary | ICD-10-CM

## 2016-04-27 DIAGNOSIS — E785 Hyperlipidemia, unspecified: Secondary | ICD-10-CM | POA: Diagnosis not present

## 2016-04-27 NOTE — Progress Notes (Signed)
Subjective:   Patient presents for Medicare Annual/Subsequent preventive examination.   No concerns today  Doing well on lexapro  Review Past Medical/Family/Social: per EMR   Risk Factors  Current exercise habits: walks Dietary issues discussed: none  Cardiac risk factors: Hyperlipidemia   Depression Screen - CURRENTLY ON TREATMENT (Note: if answer to either of the following is "Yes", a more complete depression screening is indicated)  Over the past two weeks, have you felt down, depressed or hopeless? No Over the past two weeks, have you felt little interest or pleasure in doing things? No Have you lost interest or pleasure in daily life? No Do you often feel hopeless? No Do you cry easily over simple problems? No   Activities of Daily Living  In your present state of health, do you have any difficulty performing the following activities?:  Driving? No  Managing money? No  Feeding yourself? No  Getting from bed to chair? No  Climbing a flight of stairs? No  Preparing food and eating?: No  Bathing or showering? No  Getting dressed: No  Getting to the toilet? No  Using the toilet:No  Moving around from place to place: No  In the past year have you fallen or had a near fall?:No  Are you sexually active? No  Do you have more than one partner? No   Hearing Difficulties: yes - has seen ENT  Do you often ask people to speak up or repeat themselves? yes Do you experience ringing or noises in your ears? No Do you have difficulty understanding soft or whispered voices? yes Do you feel that you have a problem with memory? yes Do you often misplace items? No  Do you feel safe at home? Yes  Cognitive Testing  Alert? Yes Normal Appearance?Yes  Oriented to person? Yes Place? Yes  Time? Yes  Recall of three objects? Yes  Can perform simple calculations? Yes  Displays appropriate judgment?Yes  Can read the correct time from a watch face?Yes   List the Names of Other  Physician/Practitioners you currently use:    Dr. Aline Brochure   Screening Tests / Date Colonoscopy   -  UTD                Zostavax - unable to afford  Mammogram UTD  Influenza Vaccine Declines  Bone Density- UTD April 2017  Pneumonia- Due for Pneumonia vaccine 23   ROS: GEN- denies fatigue, fever, weight loss,weakness, recent illness HEENT- denies eye drainage, change in vision, nasal discharge, CVS- denies chest pain, palpitations RESP- denies SOB, cough, wheeze ABD- denies N/V, change in stools, abd pain GU- denies dysuria, hematuria, dribbling, incontinence MSK- denies joint pain, muscle aches, injury Neuro- denies headache, dizziness, syncope, seizure activity  Physical: GEN- NAD, alert and oriented x3 HEENT- PERRL, EOMI, non injected sclera, pink conjunctiva, MMM, oropharynx clear Neck- Supple, no thryomegaly CVS- RRR, no murmur RESP-CTAB ABD-NABS,soft,NT,ND EXT- No edema Pulses- Radial, DP- 2+     Assessment:    Annual wellness medicare exam   Plan:    During the course of the visit the patient was educated and counseled about appropriate screening and preventive services including:  Pneumovax 23 given  Unable to afford shingles vaccine   Fasting labs reviewed at bedside, no change to statin drug Continue lexapro at current dose doing well, continue xanax as needed  Diet review for nutrition referral? Yes ____ Not Indicated __x__  Patient Instructions (the written plan) was given to the patient.  Medicare Attestation  I have personally reviewed:  The patient's medical and social history  Their use of alcohol, tobacco or illicit drugs  Their current medications and supplements  The patient's functional ability including ADLs,fall risks, home safety risks, cognitive, and hearing and visual impairment  Diet and physical activities  Evidence for depression or mood disorders  The patient's weight, height, BMI, and visual acuity have been recorded in the chart. I  have made referrals, counseling, and provided education to the patient based on review of the above and I have provided the patient with a written personalized care plan for preventive services.

## 2016-04-27 NOTE — Patient Instructions (Signed)
F/U 4 months  

## 2016-05-25 ENCOUNTER — Other Ambulatory Visit: Payer: Self-pay | Admitting: Family Medicine

## 2016-05-25 NOTE — Telephone Encounter (Signed)
Okay to refill? 

## 2016-05-25 NOTE — Telephone Encounter (Signed)
Ok to refill??  Last office visit 04/27/2016.  Last refill 03/20/2016.

## 2016-05-26 NOTE — Telephone Encounter (Signed)
Rx called in 

## 2016-07-09 ENCOUNTER — Other Ambulatory Visit: Payer: Self-pay | Admitting: Family Medicine

## 2016-07-09 NOTE — Telephone Encounter (Signed)
Ok to refill??  Last office visit 04/27/2016.  Last refill 05/26/2016.

## 2016-07-10 NOTE — Telephone Encounter (Signed)
Medication called to pharmacy. 

## 2016-07-10 NOTE — Telephone Encounter (Signed)
okay

## 2016-08-05 ENCOUNTER — Other Ambulatory Visit: Payer: Self-pay | Admitting: Family Medicine

## 2016-08-24 ENCOUNTER — Other Ambulatory Visit: Payer: Self-pay | Admitting: Family Medicine

## 2016-08-24 NOTE — Telephone Encounter (Signed)
Ok to refill??  Last office visit 04/27/2016.  Last refill 07/10/2016.

## 2016-08-24 NOTE — Telephone Encounter (Signed)
Okay to refill give 2 

## 2016-08-25 NOTE — Telephone Encounter (Signed)
Medication called to pharmacy. 

## 2016-10-27 DIAGNOSIS — B353 Tinea pedis: Secondary | ICD-10-CM | POA: Diagnosis not present

## 2016-11-03 ENCOUNTER — Other Ambulatory Visit: Payer: Self-pay | Admitting: Family Medicine

## 2016-12-09 ENCOUNTER — Ambulatory Visit (INDEPENDENT_AMBULATORY_CARE_PROVIDER_SITE_OTHER): Payer: Medicare HMO | Admitting: Family Medicine

## 2016-12-09 DIAGNOSIS — Z23 Encounter for immunization: Secondary | ICD-10-CM | POA: Diagnosis not present

## 2016-12-12 ENCOUNTER — Other Ambulatory Visit: Payer: Self-pay | Admitting: Family Medicine

## 2017-01-05 ENCOUNTER — Other Ambulatory Visit: Payer: Self-pay | Admitting: Family Medicine

## 2017-01-05 NOTE — Telephone Encounter (Signed)
Okay to refill  She should be due for OV

## 2017-01-05 NOTE — Telephone Encounter (Signed)
Medication called to pharmacy. 

## 2017-01-05 NOTE — Telephone Encounter (Signed)
Ok to refill??  Last office visit 04/27/2016.  Last refill 08/25/2016, #2 refills.

## 2017-02-04 ENCOUNTER — Other Ambulatory Visit: Payer: Self-pay | Admitting: Family Medicine

## 2017-02-21 ENCOUNTER — Other Ambulatory Visit: Payer: Self-pay | Admitting: Family Medicine

## 2017-02-22 NOTE — Telephone Encounter (Signed)
Appointment scheduled.   Medication called to pharmacy.

## 2017-02-22 NOTE — Telephone Encounter (Signed)
Ok to refill??  Last office visit 04/27/2016.  Last refill 01/05/2017.

## 2017-02-22 NOTE — Telephone Encounter (Signed)
Okay to refill,. Needs OV

## 2017-03-02 ENCOUNTER — Ambulatory Visit (INDEPENDENT_AMBULATORY_CARE_PROVIDER_SITE_OTHER): Payer: Medicare HMO | Admitting: Family Medicine

## 2017-03-02 ENCOUNTER — Encounter: Payer: Self-pay | Admitting: Family Medicine

## 2017-03-02 ENCOUNTER — Other Ambulatory Visit: Payer: Self-pay

## 2017-03-02 VITALS — BP 122/72 | HR 74 | Temp 98.6°F | Resp 14 | Ht 66.0 in | Wt 133.0 lb

## 2017-03-02 DIAGNOSIS — F419 Anxiety disorder, unspecified: Secondary | ICD-10-CM

## 2017-03-02 DIAGNOSIS — R69 Illness, unspecified: Secondary | ICD-10-CM | POA: Diagnosis not present

## 2017-03-02 DIAGNOSIS — F5104 Psychophysiologic insomnia: Secondary | ICD-10-CM | POA: Diagnosis not present

## 2017-03-02 DIAGNOSIS — F329 Major depressive disorder, single episode, unspecified: Secondary | ICD-10-CM | POA: Diagnosis not present

## 2017-03-02 DIAGNOSIS — F32A Depression, unspecified: Secondary | ICD-10-CM

## 2017-03-02 MED ORDER — ESCITALOPRAM OXALATE 20 MG PO TABS: 20.0000 mg | ORAL_TABLET | Freq: Every day | ORAL | 3 refills | Status: DC

## 2017-03-02 NOTE — Patient Instructions (Signed)
F/U March for PHYSICAL

## 2017-03-02 NOTE — Assessment & Plan Note (Signed)
Increase lexapro to 20mg  daily Continue the xanax as needed No red flags F/u in 6 weeks as scheduled will see how she is doing

## 2017-03-02 NOTE — Progress Notes (Signed)
   Subjective:    Patient ID: Sonia Oliver, female    DOB: [birth year], 67 y.o.   MRN: [id number]  Patient presents for Follow-up (is not fasting) Patient here to follow-up chronic medical problems.  She does not feel like her antidepressant anxiety medicine has been working as well.  Note she was last seen in March 2018. Currently on Lexapro 10 mg and alprazolam 0.5 mg twice a day as needed Has been irritable with her family members except her daughter who she can talk to. But daughter's husband is an alcoholic and she worries about her at times  She has notices she picks at fingers, when nerves are bad    Review Of Systems:  GEN- denies fatigue, fever, weight loss,weakness, recent illness HEENT- denies eye drainage, change in vision, nasal discharge, CVS- denies chest pain, palpitations RESP- denies SOB, cough, wheeze ABD- denies N/V, change in stools, abd pain GU- denies dysuria, hematuria, dribbling, incontinence MSK- denies joint pain, muscle aches, injury Neuro- denies headache, dizziness, syncope, seizure activity       Objective:    BP 122/72   Pulse 74   Temp 98.6 F (37 C) (Oral)   Resp 14   Ht 5\' 6"  (1.676 m)   Wt 133 lb (60.3 kg)   SpO2 96%   BMI 21.47 kg/m  GEN- NAD, alert and oriented x3 HEENT- PERRL, EOMI, non injected sclera, pink conjunctiva, MMM, oropharynx clear Neck- Supple, no thyromegaly CVS- RRR, no murmur RESP-CTAB Psych- Normal affect and mood  Pulses- Radial 2+        Assessment & Plan:      Problem List Items Addressed This Visit      Unprioritized   Chronic insomnia   Anxiety and depression - Primary    Increase lexapro to 20mg  daily Continue the xanax as needed No red flags F/u in 6 weeks as scheduled will see how she is doing      Relevant Medications   escitalopram (LEXAPRO) 20 MG tablet      Note: This dictation was prepared with Dragon dictation along with smaller phrase technology. Any transcriptional errors that  result from this process are unintentional.

## 2017-03-26 ENCOUNTER — Other Ambulatory Visit: Payer: Self-pay | Admitting: *Deleted

## 2017-03-26 MED ORDER — ESCITALOPRAM OXALATE 20 MG PO TABS: 20.0000 mg | ORAL_TABLET | Freq: Every day | ORAL | 3 refills | Status: DC

## 2017-03-30 ENCOUNTER — Encounter: Payer: Self-pay | Admitting: Family Medicine

## 2017-03-30 ENCOUNTER — Other Ambulatory Visit: Payer: Self-pay

## 2017-03-30 ENCOUNTER — Ambulatory Visit (INDEPENDENT_AMBULATORY_CARE_PROVIDER_SITE_OTHER): Payer: Medicare HMO | Admitting: Family Medicine

## 2017-03-30 VITALS — BP 128/70 | HR 62 | Temp 98.6°F | Resp 18 | Ht 66.0 in | Wt 134.0 lb

## 2017-03-30 DIAGNOSIS — J209 Acute bronchitis, unspecified: Secondary | ICD-10-CM | POA: Diagnosis not present

## 2017-03-30 DIAGNOSIS — Z720 Tobacco use: Secondary | ICD-10-CM | POA: Diagnosis not present

## 2017-03-30 DIAGNOSIS — Z20828 Contact with and (suspected) exposure to other viral communicable diseases: Secondary | ICD-10-CM | POA: Diagnosis not present

## 2017-03-30 MED ORDER — AZITHROMYCIN 250 MG PO TABS: ORAL_TABLET | ORAL | 0 refills | Status: DC

## 2017-03-30 MED ORDER — ALBUTEROL SULFATE HFA 108 (90 BASE) MCG/ACT IN AERS
2.0000 | INHALATION_SPRAY | Freq: Four times a day (QID) | RESPIRATORY_TRACT | 0 refills | Status: DC | PRN
Start: 2017-03-30 — End: 2017-04-26

## 2017-03-30 MED ORDER — PREDNISONE 20 MG PO TABS: 40.0000 mg | ORAL_TABLET | Freq: Every day | ORAL | 0 refills | Status: DC

## 2017-03-30 NOTE — Patient Instructions (Signed)
Take antibiotics, prednisone  Use coricidin for cough- has Heart on the box  Albuterol for wheezing  F/U as needed

## 2017-03-30 NOTE — Progress Notes (Signed)
   Subjective:    Patient ID: Sonia Oliver, female    DOB: [birth year], 67 y.o.   MRN: [id number]  Patient presents for Illness (x1 week- hoarse, chest congestion)    Last Tuesday started with fever, body aches, chills, diarrhea. Husband had same symptoms. Husband diagnosed with Flu A on Sunday.  Diarrhea  Has resolved.  Her appetite is much improved body aches have resolved .still has cough with production wheezing worse at night No fever in the past 48hours  No OTC medications used       Review Of Systems:  GEN- denies fatigue, fever, weight loss,weakness, recent illness HEENT- denies eye drainage, change in vision, nasal discharge, CVS- denies chest pain, palpitations RESP- denies SOB, +cough, wheeze ABD- denies N/V, change in stools, abd pain GU- denies dysuria, hematuria, dribbling, incontinence MSK- denies joint pain, muscle aches, injury Neuro- denies headache, dizziness, syncope, seizure activity       Objective:    BP 128/70   Pulse 62   Temp 98.6 F (37 C) (Oral)   Resp 18   Ht 5\' 6"  (1.676 m)   Wt 134 lb (60.8 kg)   SpO2 98%   BMI 21.63 kg/m  GEN- NAD, alert and oriented x3 HEENT- PERRL, EOMI, non injected sclera, pink conjunctiva, MMM, oropharynx clear Neck- Supple, no thyromegaly CVS- RRR, no murmur RESP-few scattered wheeze and rhonchi  ABD-NABS,soft,NT,ND EXT- No edema Pulses- Radial 2+        Assessment & Plan:      Problem List Items Addressed This Visit      Unprioritized   Tobacco use    Other Visit Diagnoses    Acute bronchitis, unspecified organism    -  Primary   Exposure to the flu and she likely had influenza as well however now over a week and most of her symptoms besides the cough and congestion have improved.  I do not see any benefit in giving her Tamiflu.  I am concerned however that her influenza has led to a bronchitis or respiratory infection.  She is also a smoker.  We will give her albuterol prednisone and  azithromycin. Chest x-ray she does not improve.   Exposure to the flu          Note: This dictation was prepared with Dragon dictation along with smaller phrase technology. Any transcriptional errors that result from this process are unintentional.

## 2017-04-06 ENCOUNTER — Other Ambulatory Visit: Payer: Self-pay | Admitting: Family Medicine

## 2017-04-06 NOTE — Telephone Encounter (Signed)
Ok to refill??  Last office visit 03/30/2017.  Last refill 02/22/2017.

## 2017-04-26 ENCOUNTER — Other Ambulatory Visit: Payer: Self-pay | Admitting: Family Medicine

## 2017-05-07 ENCOUNTER — Other Ambulatory Visit: Payer: Self-pay | Admitting: Family Medicine

## 2017-05-13 ENCOUNTER — Other Ambulatory Visit: Payer: Self-pay | Admitting: *Deleted

## 2017-05-13 MED ORDER — ALBUTEROL SULFATE HFA 108 (90 BASE) MCG/ACT IN AERS: 2.0000 | INHALATION_SPRAY | Freq: Four times a day (QID) | RESPIRATORY_TRACT | 11 refills | Status: DC | PRN

## 2017-05-27 ENCOUNTER — Other Ambulatory Visit: Payer: Self-pay | Admitting: *Deleted

## 2017-05-27 MED ORDER — ALBUTEROL SULFATE HFA 108 (90 BASE) MCG/ACT IN AERS: 2.0000 | INHALATION_SPRAY | Freq: Four times a day (QID) | RESPIRATORY_TRACT | 0 refills | Status: DC | PRN

## 2017-06-25 ENCOUNTER — Telehealth: Payer: Self-pay | Admitting: Family Medicine

## 2017-06-25 MED ORDER — ATORVASTATIN CALCIUM 20 MG PO TABS: ORAL_TABLET | ORAL | 3 refills | Status: DC

## 2017-06-25 NOTE — Telephone Encounter (Signed)
Prescription sent to pharmacy.

## 2017-06-25 NOTE — Telephone Encounter (Signed)
Refill on lipitor to [facility]

## 2017-06-28 ENCOUNTER — Encounter: Payer: Medicare HMO | Admitting: Family Medicine

## 2017-07-02 ENCOUNTER — Ambulatory Visit (INDEPENDENT_AMBULATORY_CARE_PROVIDER_SITE_OTHER): Payer: Medicare HMO | Admitting: Family Medicine

## 2017-07-02 ENCOUNTER — Other Ambulatory Visit: Payer: Self-pay

## 2017-07-02 ENCOUNTER — Other Ambulatory Visit: Payer: Self-pay | Admitting: Family Medicine

## 2017-07-02 ENCOUNTER — Encounter: Payer: Self-pay | Admitting: Family Medicine

## 2017-07-02 VITALS — BP 130/72 | HR 66 | Temp 97.9°F | Resp 12 | Ht 66.0 in | Wt 131.0 lb

## 2017-07-02 DIAGNOSIS — F329 Major depressive disorder, single episode, unspecified: Secondary | ICD-10-CM | POA: Diagnosis not present

## 2017-07-02 DIAGNOSIS — Z1231 Encounter for screening mammogram for malignant neoplasm of breast: Secondary | ICD-10-CM

## 2017-07-02 DIAGNOSIS — F419 Anxiety disorder, unspecified: Secondary | ICD-10-CM

## 2017-07-02 DIAGNOSIS — Z0000 Encounter for general adult medical examination without abnormal findings: Secondary | ICD-10-CM | POA: Diagnosis not present

## 2017-07-02 DIAGNOSIS — F5104 Psychophysiologic insomnia: Secondary | ICD-10-CM

## 2017-07-02 DIAGNOSIS — R69 Illness, unspecified: Secondary | ICD-10-CM | POA: Diagnosis not present

## 2017-07-02 DIAGNOSIS — Z1159 Encounter for screening for other viral diseases: Secondary | ICD-10-CM | POA: Diagnosis not present

## 2017-07-02 DIAGNOSIS — E785 Hyperlipidemia, unspecified: Secondary | ICD-10-CM

## 2017-07-02 DIAGNOSIS — F32A Depression, unspecified: Secondary | ICD-10-CM

## 2017-07-02 DIAGNOSIS — Z1239 Encounter for other screening for malignant neoplasm of breast: Secondary | ICD-10-CM

## 2017-07-02 MED ORDER — ALPRAZOLAM 0.5 MG PO TABS: ORAL_TABLET | ORAL | 1 refills | Status: DC

## 2017-07-02 MED ORDER — ATORVASTATIN CALCIUM 20 MG PO TABS: 20.0000 mg | ORAL_TABLET | Freq: Every day | ORAL | 3 refills | Status: DC

## 2017-07-02 MED ORDER — ESCITALOPRAM OXALATE 20 MG PO TABS: 20.0000 mg | ORAL_TABLET | Freq: Every day | ORAL | 3 refills | Status: DC

## 2017-07-02 NOTE — Telephone Encounter (Signed)
Ok to refill 

## 2017-07-02 NOTE — Telephone Encounter (Signed)
Med refill on xanax to [facility]

## 2017-07-02 NOTE — Progress Notes (Signed)
Subjective:   Patient presents for Medicare Annual/Subsequent preventive examination.   No specific concerns  Taking xanax and lexapro- takes xanax at bedtime Hyperlipidemia- taking lipitor 20mg    Review Past Medical/Family/Social: Per EMR    Risk Factors  Current exercise habits: walks some Dietary issues discussed: Yes  Cardiac risk factors: Obesity (BMI >= 30 kg/m2).   Depression Screen  (Note: if answer to either of the following is "Yes", a more complete depression screening is indicated)  Over the past two weeks, have you felt down, depressed or hopeless? No Over the past two weeks, have you felt little interest or pleasure in doing things? No Have you lost interest or pleasure in daily life? No Do you often feel hopeless? No Do you cry easily over simple problems? No   Activities of Daily Living  In your present state of health, do you have any difficulty performing the following activities?:  Driving? No  Managing money? No  Feeding yourself? No  Getting from bed to chair? No  Climbing a flight of stairs? No  Preparing food and eating?: No  Bathing or showering? No  Getting dressed: No  Getting to the toilet? No  Using the toilet:No  Moving around from place to place: No  In the past year have you fallen or had a near fall?:No  Are you sexually active?yes  Do you have more than one partner? No   Hearing Difficulties: Yes- Chronic Do you often ask people to speak up or repeat themselves? No  Do you experience ringing or noises in your ears? Yes Do you have difficulty understanding soft or whispered voices? Yes Do you feel that you have a problem with memory? No Do you often misplace items? No  Do you feel safe at home? Yes  Cognitive Testing  Alert? Yes Normal Appearance?Yes  Oriented to person? Yes Place? Yes  Time? Yes  Recall of three objects? Yes  Can perform simple calculations? Yes  Displays appropriate judgment?Yes  Can read the correct time from a  watch face?Yes   List the Names of Other Physician/Practitioners you currently use: GI - Dr. Penelope Coop   Screening Tests / Date Colonoscopy  utd                     Zostavax - Due Mammogram Due Influenza Vaccine UTD Pneumonia- UTD Tetanus/tdapUTD  ROS: GEN- denies fatigue, fever, weight loss,weakness, recent illness HEENT- denies eye drainage, change in vision, nasal discharge, CVS- denies chest pain, palpitations RESP- denies SOB, cough, wheeze ABD- denies N/V, change in stools, abd pain GU- denies dysuria, hematuria, dribbling, incontinence MSK- denies joint pain, muscle aches, injury Neuro- denies headache, dizziness, syncope, seizure activity  Physical: Vitals reviewed  GEN- NAD, alert and oriented x3 HEENT- PERRL, EOMI, non injected sclera, pink conjunctiva, MMM, oropharynx clear, TM clear bilat no effusion  Neck- Supple, no thryomegaly, no bruit CVS- RRR, no murmur RESP-CTAB ABD-NABS,sof,NT,ND Psych- normal affect and mood EXT- No edema Pulses- Radial, DP- 2+    Assessment:    Annual wellness medicare exam   Plan:    During the course of the visit the patient was educated and counseled about appropriate screening and preventive services including:  Screening mammography  Obtain results from colonoscopy Shingles vaccine. Prescription given to that she can get the vaccine at the pharmacy or Medicare part D.  Depression with anxiety- doing well on lexapro, xanax for anxiety and chronic insomnia, PHQ-9 score 0  Given handout for Living will/end  of life care  Mild Hyperlipidemia- obtain lipid panel on lipitor and ASA  Fasting labs/Hep C screening     Diet review for nutrition referral? Yes ____ Not Indicated __x__  Patient Instructions (the written plan) was given to the patient.  Medicare Attestation  I have personally reviewed:  The patient's medical and social history  Their use of alcohol, tobacco or illicit drugs  Their current medications and  supplements  The patient's functional ability including ADLs,fall risks, home safety risks, cognitive, and hearing and visual impairment  Diet and physical activities  Evidence for depression or mood disorders  The patient's weight, height, BMI, and visual acuity have been recorded in the chart. I have made referrals, counseling, and provided education to the patient based on review of the above and I have provided the patient with a written personalized care plan for preventive services.

## 2017-07-02 NOTE — Patient Instructions (Addendum)
Schedule your mammogram  Release of records- Dr. Penelope Coop I NEED LAST COLONOSCOPY Shingrix sent to pharmacy  We will call with lab results  F/U 6 months

## 2017-07-03 LAB — CBC WITH DIFFERENTIAL/PLATELET
BASOS PCT: 0.6 %
Basophils Absolute: 50 cells/uL (ref 0–200)
EOS ABS: 100 {cells}/uL (ref 15–500)
Eosinophils Relative: 1.2 %
HCT: 41.1 % (ref 35.0–45.0)
Hemoglobin: 13.8 g/dL (ref 11.7–15.5)
Lymphs Abs: 2357 cells/uL (ref 850–3900)
MCH: 29.7 pg (ref 27.0–33.0)
MCHC: 33.6 g/dL (ref 32.0–36.0)
MCV: 88.4 fL (ref 80.0–100.0)
MPV: 10.7 fL (ref 7.5–12.5)
Monocytes Relative: 4.6 %
Neutro Abs: 5412 cells/uL (ref 1500–7800)
Neutrophils Relative %: 65.2 %
PLATELETS: 252 10*3/uL (ref 140–400)
RBC: 4.65 10*6/uL (ref 3.80–5.10)
RDW: 12.6 % (ref 11.0–15.0)
TOTAL LYMPHOCYTE: 28.4 %
WBC: 8.3 10*3/uL (ref 3.8–10.8)
WBCMIX: 382 {cells}/uL (ref 200–950)

## 2017-07-03 LAB — COMPREHENSIVE METABOLIC PANEL
AG Ratio: 1.9 (calc) (ref 1.0–2.5)
ALKALINE PHOSPHATASE (APISO): 52 U/L (ref 33–130)
ALT: 8 U/L (ref 6–29)
AST: 13 U/L (ref 10–35)
Albumin: 4.1 g/dL (ref 3.6–5.1)
BILIRUBIN TOTAL: 0.6 mg/dL (ref 0.2–1.2)
BUN/Creatinine Ratio: 13 (calc) (ref 6–22)
BUN: 13 mg/dL (ref 7–25)
CALCIUM: 9.4 mg/dL (ref 8.6–10.4)
CO2: 25 mmol/L (ref 20–32)
Chloride: 106 mmol/L (ref 98–110)
Creat: 1.04 mg/dL — ABNORMAL HIGH (ref 0.50–0.99)
GLOBULIN: 2.2 g/dL (ref 1.9–3.7)
Glucose, Bld: 97 mg/dL (ref 65–99)
Potassium: 4.3 mmol/L (ref 3.5–5.3)
Sodium: 143 mmol/L (ref 135–146)
Total Protein: 6.3 g/dL (ref 6.1–8.1)

## 2017-07-03 LAB — LIPID PANEL
Cholesterol: 118 mg/dL (ref <200)
HDL: 42 mg/dL — AB (ref >50)
LDL CHOLESTEROL (CALC): 59 mg/dL
Non-HDL Cholesterol (Calc): 76 mg/dL (calc) (ref <130)
TRIGLYCERIDES: 89 mg/dL (ref <150)
Total CHOL/HDL Ratio: 2.8 (calc) (ref <5.0)

## 2017-07-03 LAB — HEPATITIS C ANTIBODY
Hepatitis C Ab: NONREACTIVE
SIGNAL TO CUT-OFF: 0.01 (ref <1.00)

## 2017-07-05 ENCOUNTER — Encounter: Payer: Self-pay | Admitting: *Deleted

## 2017-07-28 ENCOUNTER — Ambulatory Visit: Payer: Medicare HMO

## 2017-07-28 ENCOUNTER — Ambulatory Visit
Admission: RE | Admit: 2017-07-28 | Discharge: 2017-07-28 | Disposition: A | Discharge status: Home / Self Care | Payer: Medicare HMO | Source: Ambulatory Visit | Attending: Family Medicine | Admitting: Family Medicine

## 2017-07-28 DIAGNOSIS — Z1239 Encounter for other screening for malignant neoplasm of breast: Secondary | ICD-10-CM

## 2017-07-28 DIAGNOSIS — Z1231 Encounter for screening mammogram for malignant neoplasm of breast: Secondary | ICD-10-CM | POA: Diagnosis not present

## 2017-09-29 ENCOUNTER — Emergency Department (HOSPITAL_COMMUNITY): Payer: Medicare HMO

## 2017-09-29 ENCOUNTER — Encounter (HOSPITAL_COMMUNITY): Payer: Self-pay

## 2017-09-29 ENCOUNTER — Emergency Department (HOSPITAL_COMMUNITY)
Admission: EM | Admit: 2017-09-29 | Discharge: 2017-09-29 | Disposition: A | Discharge status: Home / Self Care | Payer: Medicare HMO | Attending: Emergency Medicine | Admitting: Emergency Medicine

## 2017-09-29 ENCOUNTER — Other Ambulatory Visit: Payer: Self-pay

## 2017-09-29 DIAGNOSIS — F17210 Nicotine dependence, cigarettes, uncomplicated: Secondary | ICD-10-CM | POA: Diagnosis not present

## 2017-09-29 DIAGNOSIS — M545 Low back pain: Secondary | ICD-10-CM | POA: Diagnosis not present

## 2017-09-29 DIAGNOSIS — M79604 Pain in right leg: Secondary | ICD-10-CM | POA: Diagnosis not present

## 2017-09-29 DIAGNOSIS — M5489 Other dorsalgia: Secondary | ICD-10-CM | POA: Diagnosis present

## 2017-09-29 DIAGNOSIS — Z7982 Long term (current) use of aspirin: Secondary | ICD-10-CM | POA: Diagnosis not present

## 2017-09-29 DIAGNOSIS — Z79899 Other long term (current) drug therapy: Secondary | ICD-10-CM | POA: Insufficient documentation

## 2017-09-29 DIAGNOSIS — M25551 Pain in right hip: Secondary | ICD-10-CM | POA: Diagnosis not present

## 2017-09-29 DIAGNOSIS — R69 Illness, unspecified: Secondary | ICD-10-CM | POA: Diagnosis not present

## 2017-09-29 MED ORDER — NAPROXEN 500 MG PO TABS: 500.0000 mg | ORAL_TABLET | Freq: Two times a day (BID) | ORAL | 0 refills | Status: DC

## 2017-09-29 NOTE — ED Provider Notes (Signed)
[place] EMERGENCY DEPARTMENT Provider Note   CSN: [id number] Arrival date & time: 09/29/17  1524     History   Chief Complaint Chief Complaint  Patient presents with  . Back Pain    HPI Sonia Oliver is a 67 y.o. female.  HPI   67 year old female presents today with complaints of right posterior hip pain. Patient notes 3 days ago she lifted up her grandchild. She has pain in the right posterior hip since then. Worse with At the hip. She denies any low back pain, denies any distal neurological deficits. Patient notes she has had hip pain in the past, not this severe. She reports taking Tylenol without significant improvement in her symptoms.   Past Medical History:  Diagnosis Date  . Cancer ([place]) 11/11/2009  . Deaf   . Hyperlipidemia   . Stroke [facility]) 04/19/2014    Patient Active Problem List   Diagnosis Date Noted  . Tobacco use 03/30/2017  . OA (osteoarthritis) of hip 03/20/2016  . Conversion disorder 03/20/2016  . Tinnitus 04/26/2015  . Chronic insomnia 08/01/2014  . Mild hyperlipidemia 08/01/2014  . Anxiety and depression 04/21/2014    Past Surgical History:  Procedure Laterality Date  . ABDOMINAL HYSTERECTOMY    . BREAST CYST EXCISION Left   . COLON SURGERY       OB History    Gravida  0   Para  0   Term  0   Preterm  0   AB  0   Living        SAB  0   TAB  0   Ectopic  0   Multiple      Live Births               Home Medications    Prior to Admission medications   Medication Sig Start Date End Date Taking? Authorizing Provider  albuterol (PROVENTIL HFA;VENTOLIN HFA) 108 (90 Base) MCG/ACT inhaler Inhale 2 puffs into the lungs every 6 (six) hours as needed for wheezing or shortness of breath. 05/27/17  Yes [place], Modena Nunnery, MD  ALPRAZolam Duanne Moron) 0.5 MG tablet TAKE 1 TABLET TWICE DAILY AS NEEDED ANXIETY 07/02/17  Yes [place], Modena Nunnery, MD  aspirin EC 81 MG tablet Take 81 mg by mouth daily.   Yes [provider]  atorvastatin (LIPITOR) 20 MG tablet Take 1 tablet (20 mg total) by mouth daily. 07/02/17  Yes [place], Modena Nunnery, MD  escitalopram (LEXAPRO) 20 MG tablet Take 1 tablet (20 mg total) by mouth daily. 07/02/17  Yes , Modena Nunnery, MD  fexofenadine (ALLEGRA) 180 MG tablet Take 180 mg by mouth daily.   Yes [provider]  naproxen (NAPROSYN) 500 MG tablet Take 1 tablet (500 mg total) by mouth 2 (two) times daily. 09/29/17   Okey Regal, PA-C    Family History Family History  Problem Relation Age of Onset  . Arthritis Mother   . Diabetes Mother   . Stroke Mother   . Early death Father   . Heart disease Father   . Arthritis Sister   . Hyperlipidemia Sister   . Hypertension Sister   . Early death Sister   . Depression Sister   . Drug abuse Sister   . Breast cancer Sister        47's or ealy 16's  . Hyperlipidemia Brother   . Hypertension Brother   . Early death Brother   . Arthritis Sister   . Hyperlipidemia  Sister   . Hypertension Sister   . Depression Sister   . Drug abuse Sister   . Arthritis Sister   . Hyperlipidemia Sister   . Hypertension Sister   . Depression Sister   . Drug abuse Sister   . Breast cancer Maternal Aunt     Social History Social History   Tobacco Use  . Smoking status: Current Every Day Smoker    Packs/day: 0.50    Types: Cigarettes  . Smokeless tobacco: Never Used  Substance Use Topics  . Alcohol use: No  . Drug use: No     Allergies   Codeine and Sulfa antibiotics   Review of Systems Review of Systems  All other systems reviewed and are negative.    Physical Exam Updated Vital Signs BP (!) 142/96   Pulse (!) 49   Temp 98.4 F (36.9 C) (Oral)   Resp 16   Ht 5\' 4"  (1.626 m)   Wt 59 kg (130 lb)   SpO2 97%   BMI 22.31 kg/m   Physical Exam  Constitutional: She is oriented to person, place, and time. She appears well-developed and well-nourished.  HENT:  Head: Normocephalic and atraumatic.  Eyes:  Pupils are equal, round, and reactive to light. Conjunctivae are normal. Right eye exhibits no discharge. Left eye exhibits no discharge. No scleral icterus.  Neck: Normal range of motion. No JVD present. No tracheal deviation present.  Pulmonary/Chest: Effort normal. No stridor.  Musculoskeletal:  No CT or L-spine tenderness palpation, no tenderness palpation of the hip.- Distal sensation strength or motor function intact, exquisite pain to the posterior hip with forward hip flexion- no pain with internal/external rotation of the hip- pedal pulse 2+  Neurological: She is alert and oriented to person, place, and time. Coordination normal.  Psychiatric: She has a normal mood and affect. Her behavior is normal. Judgment and thought content normal.  Nursing note and vitals reviewed.    ED Treatments / Results  Labs (all labs ordered are listed, but only abnormal results are displayed) Labs Reviewed - No data to display  EKG None  Radiology Dg Chest 2 View  Result Date: 09/29/2017 CLINICAL DATA:  RIGHT lower back pain radiating to RIGHT leg after picking up her nephew on Monday EXAM: CHEST - 2 VIEW COMPARISON:  None FINDINGS: Normal heart size, mediastinal contours, and pulmonary vascularity. Atherosclerotic calcification aorta. Lungs clear. No pulmonary infiltrate, pleural effusion or pneumothorax. Bones appear demineralized without acute abnormality. IMPRESSION: No acute abnormalities. Electronically Signed   By: Lavonia Dana M.D.   On: 09/29/2017 18:01   Dg Hip Unilat W Or Wo Pelvis 2-3 Views Right  Result Date: 09/29/2017 CLINICAL DATA:  RIGHT lower back pain radiating to RIGHT leg after picking up her nephew on Monday EXAM: DG HIP (WITH OR WITHOUT PELVIS) 2-3V RIGHT COMPARISON:  None FINDINGS: Small BILATERAL pelvic phleboliths. Surgical staple lines in upper RIGHT pelvis laterally likely prior bowel surgery. Osseous demineralization. RIGHT hip and SI joint spaces preserved. No acute  fracture, dislocation, or bone destruction. Visualized lower lumbar spine unremarkable. IMPRESSION: Osseous demineralization without acute bony abnormalities. Electronically Signed   By: Lavonia Dana M.D.   On: 09/29/2017 18:02    Procedures Procedures (including critical care time)  Medications Ordered in ED Medications - No data to display   Initial Impression / Assessment and Plan / ED Course  I have reviewed the triage vital signs and the nursing notes.  Pertinent labs & imaging results that were available  during my care of the patient were reviewed by me and considered in my medical decision making (see chart for details).     67 year old female presents today with likely musculoskeletal hip pain.  The acute abnormalities noted on plain films, no neurological deficits 8 for outpatient follow-up with symptomatic care. Strict return precautions given. Patient verbalized understanding and agreement to today's plan had no further questions or concerns.   Final Clinical Impressions(s) / ED Diagnoses   Final diagnoses:  Pain of right hip joint    ED Discharge Orders        Ordered    naproxen (NAPROSYN) 500 MG tablet  2 times daily     09/29/17 1832       Okey Regal, PA-C 09/29/17 1851    Hayden Rasmussen, MD 09/30/17 [phone]

## 2017-09-29 NOTE — ED Triage Notes (Signed)
Pt endorses right lower back pain radiating down the right leg after picking up her nephew Monday. Pt was told that she has arthritis in her back. VSS. Denies urinary sx or neuro sx

## 2017-09-29 NOTE — Discharge Instructions (Addendum)
Please read attached information. If you experience any new or worsening signs or symptoms please return to the emergency room for evaluation. Please follow-up with your primary care provider or specialist as discussed. Please use medication prescribed only as directed and discontinue taking if you have any concerning signs or symptoms.   °

## 2017-10-06 ENCOUNTER — Other Ambulatory Visit: Payer: Self-pay | Admitting: Family Medicine

## 2017-10-07 NOTE — Telephone Encounter (Signed)
Ok to refill??  Last office visit/ 07/02/2017, #1 refill.

## 2017-10-08 DIAGNOSIS — R69 Illness, unspecified: Secondary | ICD-10-CM | POA: Diagnosis not present

## 2017-11-01 DIAGNOSIS — I10 Essential (primary) hypertension: Secondary | ICD-10-CM | POA: Diagnosis not present

## 2017-11-01 DIAGNOSIS — J018 Other acute sinusitis: Secondary | ICD-10-CM | POA: Diagnosis not present

## 2017-11-11 ENCOUNTER — Ambulatory Visit (INDEPENDENT_AMBULATORY_CARE_PROVIDER_SITE_OTHER): Payer: Medicare HMO | Admitting: Physician Assistant

## 2017-11-11 ENCOUNTER — Encounter: Payer: Self-pay | Admitting: Physician Assistant

## 2017-11-11 ENCOUNTER — Ambulatory Visit
Admission: RE | Admit: 2017-11-11 | Discharge: 2017-11-11 | Disposition: A | Discharge status: Home / Self Care | Payer: Medicare HMO | Source: Ambulatory Visit | Attending: Physician Assistant | Admitting: Physician Assistant

## 2017-11-11 VITALS — BP 118/80 | HR 54 | Temp 98.2°F | Resp 16 | Ht 66.0 in | Wt 132.2 lb

## 2017-11-11 DIAGNOSIS — J988 Other specified respiratory disorders: Secondary | ICD-10-CM

## 2017-11-11 DIAGNOSIS — B9689 Other specified bacterial agents as the cause of diseases classified elsewhere: Secondary | ICD-10-CM

## 2017-11-11 DIAGNOSIS — R05 Cough: Secondary | ICD-10-CM | POA: Diagnosis not present

## 2017-11-11 MED ORDER — ALBUTEROL SULFATE HFA 108 (90 BASE) MCG/ACT IN AERS: 2.0000 | INHALATION_SPRAY | Freq: Four times a day (QID) | RESPIRATORY_TRACT | 0 refills | Status: DC | PRN

## 2017-11-11 MED ORDER — PREDNISONE 20 MG PO TABS: 20.0000 mg | ORAL_TABLET | Freq: Every day | ORAL | 0 refills | Status: DC

## 2017-11-11 MED ORDER — LEVOFLOXACIN 750 MG PO TABS: 750.0000 mg | ORAL_TABLET | Freq: Every day | ORAL | 0 refills | Status: AC

## 2017-11-11 NOTE — Progress Notes (Signed)
Patient ID: LASHAWN BROMWELL MRN: [id number], DOB: March 03, 1950, 67 y.o. Date of Encounter: 11/11/2017, 2:26 PM    Chief Complaint:  Chief Complaint  Patient presents with  . Cough    symptoms for 2 weeks   . chest congestion     HPI: 67 y.o. year old female presents with above.   She reports that she actually went to an urgent care about 2 weeks ago and says that she was treated with a Depo-Medrol injection and doxycycline.  Says that she completed the doxycycline this past Tuesday which was 11/09/2017.  She states that at this point she is mostly having chest congestion and cough.  States that at times her nose will feel a little congested and that she occasionally blows a little bit of drainage out of her nose but that most of the symptoms are in her chest.  Says that she still has cough and still has thick dark phlegm present.  Had no fevers or chills.  Feels as if she is wheezing at times especially sometimes at night.     Home Meds:   Outpatient Medications Prior to Visit  Medication Sig Dispense Refill  . ALPRAZolam (XANAX) 0.5 MG tablet TAKE 1 TABLET TWICE DAILY AS NEEDED ANXIETY 45 tablet 1  . aspirin EC 81 MG tablet Take 81 mg by mouth daily.    Marland Kitchen atorvastatin (LIPITOR) 20 MG tablet Take 1 tablet (20 mg total) by mouth daily. 90 tablet 3  . escitalopram (LEXAPRO) 20 MG tablet Take 1 tablet (20 mg total) by mouth daily. 90 tablet 3  . fexofenadine (ALLEGRA) 180 MG tablet Take 180 mg by mouth daily.    . naproxen (NAPROSYN) 500 MG tablet Take 1 tablet (500 mg total) by mouth 2 (two) times daily. 30 tablet 0  . albuterol (PROVENTIL HFA;VENTOLIN HFA) 108 (90 Base) MCG/ACT inhaler Inhale 2 puffs into the lungs every 6 (six) hours as needed for wheezing or shortness of breath. 1 Inhaler 0   No facility-administered medications prior to visit.     Allergies:  Allergies  Allergen Reactions  . Codeine Shortness Of Breath  . Sulfa Antibiotics Nausea Only      Review of  Systems: See HPI for pertinent ROS. All other ROS negative.    Physical Exam: Blood pressure 118/80, pulse (!) 54, temperature 98.2 F (36.8 C), temperature source Oral, resp. rate 16, height 5\' 6"  (1.676 m), weight 60 kg, SpO2 98 %., Body mass index is 21.34 kg/m. General:  WNWD WF. Appears in no acute distress. HEENT: Normocephalic, atraumatic, eyes without discharge, sclera non-icteric, nares are without discharge. Bilateral auditory canals clear, TM's are without perforation, pearly grey and translucent with reflective cone of light bilaterally. Oral cavity moist, posterior pharynx without exudate, erythema, peritonsillar abscess.  No tenderness with percussion to frontal or maxillary sinuses.  Neck: Supple. No thyromegaly. No lymphadenopathy. Lungs: Clear bilaterally to auscultation without wheezes, rales, or rhonchi. Breathing is unlabored.  No wheezes on exam at this time. Heart: Regular rhythm. No murmurs, rubs, or gallops. Msk:  Strength and tone normal for age. Extremities/Skin: Warm and dry.  Neuro: Alert and oriented X 3. Moves all extremities spontaneously. GaitII grossly in tact. Psych:  Responds to questions appropriately with a normal affect.     ASSESSMENT AND PLAN:  67 y.o. year old female with   1. Bacterial respiratory infection Will obtain x-ray given that she is continuing with symptoms despite being treated with Depo-Medrol and doxycycline. Follow-up with  her once I get this report. We will plan for her to proceed with Levaquin and will give prednisone 20 mg since she reports having some wheezing at night. Noted that she is afebrile here.  Oxygen sat is 98% on room air.  Lungs are clear with no wheezing on exam currently. - levofloxacin (LEVAQUIN) 750 MG tablet; Take 1 tablet (750 mg total) by mouth daily for 7 days.  Dispense: 7 tablet; Refill: 0 - predniSONE (DELTASONE) 20 MG tablet; Take 1 tablet (20 mg total) by mouth daily with breakfast.  Dispense: 5 tablet;  Refill: 0 - DG Chest 2 View; Future - albuterol (PROVENTIL HFA;VENTOLIN HFA) 108 (90 Base) MCG/ACT inhaler; Inhale 2 puffs into the lungs every 6 (six) hours as needed for wheezing or shortness of breath.  Dispense: 1 Inhaler; Refill: 0   Signed, [address] [place], [place], [facility] 11/11/2017 2:26 PM

## 2018-01-05 ENCOUNTER — Other Ambulatory Visit: Payer: Self-pay | Admitting: Family Medicine

## 2018-01-05 NOTE — Telephone Encounter (Signed)
Ok to refill??  Last office visit 07/02/2017.  Last refill 10/08/2017., #1 refill.

## 2018-01-17 ENCOUNTER — Encounter: Payer: Self-pay | Admitting: Family Medicine

## 2018-01-17 ENCOUNTER — Ambulatory Visit (INDEPENDENT_AMBULATORY_CARE_PROVIDER_SITE_OTHER): Payer: Medicare HMO | Admitting: Family Medicine

## 2018-01-17 ENCOUNTER — Other Ambulatory Visit: Payer: Self-pay

## 2018-01-17 VITALS — BP 118/68 | HR 60 | Temp 98.4°F | Resp 14 | Ht 66.0 in | Wt 133.0 lb

## 2018-01-17 DIAGNOSIS — F419 Anxiety disorder, unspecified: Secondary | ICD-10-CM | POA: Diagnosis not present

## 2018-01-17 DIAGNOSIS — F329 Major depressive disorder, single episode, unspecified: Secondary | ICD-10-CM

## 2018-01-17 DIAGNOSIS — E785 Hyperlipidemia, unspecified: Secondary | ICD-10-CM | POA: Diagnosis not present

## 2018-01-17 DIAGNOSIS — F5104 Psychophysiologic insomnia: Secondary | ICD-10-CM | POA: Diagnosis not present

## 2018-01-17 DIAGNOSIS — F32A Depression, unspecified: Secondary | ICD-10-CM

## 2018-01-17 DIAGNOSIS — R69 Illness, unspecified: Secondary | ICD-10-CM | POA: Diagnosis not present

## 2018-01-17 NOTE — Patient Instructions (Signed)
Continue current medications F/U 6 months Physical

## 2018-01-17 NOTE — Assessment & Plan Note (Addendum)
Continue lipitor   No rate lowering medications, asymptomatic, given reassurance on HR at this time

## 2018-01-17 NOTE — Progress Notes (Signed)
   Subjective:    Patient ID: Sonia Oliver, female    DOB: 12-27-1950, 67 y.o.   MRN: [id number]  Patient presents for Follow-up (is not fasting)    Pt here to f/u chronic medical problems   Medications refilled   Since our last visit  Treated for URI, seen in ER in August with back pain mostly on left side and into her hip, had xray of hip which was normal except for known osteopenia, Given naprosyn    Hyperlipidemia- taking lipitor as prescribed   GAD/Depression- taking lexapro 20mg  and xanax as needed   Asked about her HR, no symptoms but always 50-60's    Review Of Systems:  GEN- denies fatigue, fever, weight loss,weakness, recent illness HEENT- denies eye drainage, change in vision, nasal discharge, CVS- denies chest pain, palpitations RESP- denies SOB, cough, wheeze ABD- denies N/V, change in stools, abd pain GU- denies dysuria, hematuria, dribbling, incontinence MSK- denies joint pain, muscle aches, injury Neuro- denies headache, dizziness, syncope, seizure activity       Objective:    BP 118/68   Pulse 60   Temp 98.4 F (36.9 C) (Oral)   Resp 14   Ht 5\' 6"  (1.676 m)   Wt 133 lb (60.3 kg)   SpO2 96%   BMI 21.47 kg/m  GEN- NAD, alert and oriented x3 HEENT- PERRL, EOMI, non injected sclera, pink conjunctiva, MMM, oropharynx clear Neck- Supple, no thyromegaly CVS- RRR, no murmur RESP-CTAB ABD-NABS,soft,NT,ND MSK-Spine NT, fair ROM SPINE/HIPS/KNEES Psych-normal affect and mood EXT- No edema Pulses- Radial, DP- 2+        Assessment & Plan:      Problem List Items Addressed This Visit      Unprioritized   Anxiety and depression    Continue Lexapro and Xanax, no changes      Chronic insomnia   Mild hyperlipidemia - Primary    Continue lipitor   No rate lowering medications, asymptomatic, given reassurance on HR at this time         Note: This dictation was prepared with Dragon dictation along with smaller phrase technology. Any  transcriptional errors that result from this process are unintentional.

## 2018-01-17 NOTE — Assessment & Plan Note (Signed)
Continue Lexapro and Xanax, no changes

## 2018-04-05 ENCOUNTER — Other Ambulatory Visit: Payer: Self-pay | Admitting: Family Medicine

## 2018-04-05 NOTE — Telephone Encounter (Signed)
Pt is requesting refill on Xanax   LOV: 01/17/18  LRF:   01/06/18

## 2018-04-08 ENCOUNTER — Telehealth: Payer: Self-pay | Admitting: Family Medicine

## 2018-04-08 MED ORDER — ALPRAZOLAM 0.5 MG PO TABS: ORAL_TABLET | ORAL | 1 refills | Status: DC

## 2018-04-08 NOTE — Telephone Encounter (Signed)
Pt needs refill on xanax to [facility] they did not receive the one that was sent on 04/05/2018 due to power outage. She is completely out.

## 2018-07-06 ENCOUNTER — Encounter: Payer: Self-pay | Admitting: *Deleted

## 2018-07-06 ENCOUNTER — Other Ambulatory Visit: Payer: Self-pay | Admitting: Family Medicine

## 2018-07-06 NOTE — Telephone Encounter (Signed)
Letter sent.

## 2018-07-06 NOTE — Telephone Encounter (Signed)
Schedule for telephone visit to f/u meds

## 2018-07-06 NOTE — Telephone Encounter (Signed)
Ok to refill??  Last office visit 01/17/2018.  Last refill 04/08/2018, #1 refill.

## 2018-07-27 ENCOUNTER — Ambulatory Visit (INDEPENDENT_AMBULATORY_CARE_PROVIDER_SITE_OTHER): Payer: Medicare HMO | Admitting: Family Medicine

## 2018-07-27 ENCOUNTER — Other Ambulatory Visit: Payer: Self-pay

## 2018-07-27 DIAGNOSIS — Z7189 Other specified counseling: Secondary | ICD-10-CM

## 2018-07-27 MED ORDER — FEXOFENADINE HCL 180 MG PO TABS: 180.0000 mg | ORAL_TABLET | Freq: Every day | ORAL | 1 refills | Status: AC

## 2018-07-27 MED ORDER — ATORVASTATIN CALCIUM 20 MG PO TABS: 20.0000 mg | ORAL_TABLET | Freq: Every day | ORAL | 1 refills | Status: DC

## 2018-07-27 MED ORDER — ALPRAZOLAM 0.5 MG PO TABS: ORAL_TABLET | ORAL | 1 refills | Status: DC

## 2018-07-27 MED ORDER — ESCITALOPRAM OXALATE 20 MG PO TABS: 20.0000 mg | ORAL_TABLET | Freq: Every day | ORAL | 1 refills | Status: DC

## 2018-07-27 NOTE — Progress Notes (Signed)
Virtual Visit via Telephone Note  I connected with Sonia Oliver on 07/27/18 at  9:15 AM EDT by telephone and verified that I am speaking with the correct person using two identifiers.      Pt location: at home   Physician location:  In office, [facility], Vic Blackbird MD     On call: patient and physician   I discussed the limitations, risks, security and privacy concerns of performing an evaluation and management service by telephone and the availability of in person appointments. I also discussed with the patient that there may be a patient responsible charge related to this service. The patient expressed understanding and agreed to proceed.   History of Present Illness:   Pt husband works in a facility where they have had a COVID positive person last week.  Husband is going to work and feels fine. She has not had any symptoms She has not been out in the community  She want to know what precautions she should take.  And also asked about testing.   Observations/Objective:  NAD, normal work of breathing on the phone  Assessment and Plan: No direct COVID 19 contact.  Her husband isStill working.  Recommend that they both monitor the temperatures watch for the development of any respiratory symptoms over the next 7 to 10 days  She voiced understanding.  Testing is not needed at this time. 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 The patient was provided an opportunity to ask questions and all were answered. The patient agreed with the plan and demonstrated an understanding of the instructions.   The patient was advised to call back or seek an in-person evaluation if the symptoms worsen or if the condition fails to improve as anticipated.  I provided 10 minutes of non-face-to-face time during this encounter. End  Time: 9:25am  Vic Blackbird, MD

## 2018-07-28 ENCOUNTER — Encounter: Payer: Self-pay | Admitting: Family Medicine

## 2018-10-05 ENCOUNTER — Other Ambulatory Visit: Payer: Self-pay | Admitting: Family Medicine

## 2018-10-05 DIAGNOSIS — Z1231 Encounter for screening mammogram for malignant neoplasm of breast: Secondary | ICD-10-CM

## 2018-10-10 ENCOUNTER — Other Ambulatory Visit: Payer: Self-pay

## 2018-10-10 ENCOUNTER — Ambulatory Visit
Admission: RE | Admit: 2018-10-10 | Discharge: 2018-10-10 | Disposition: A | Discharge status: Home / Self Care | Payer: Medicare HMO | Source: Ambulatory Visit | Attending: Family Medicine | Admitting: Family Medicine

## 2018-10-10 DIAGNOSIS — Z1231 Encounter for screening mammogram for malignant neoplasm of breast: Secondary | ICD-10-CM

## 2018-11-01 ENCOUNTER — Other Ambulatory Visit: Payer: Self-pay

## 2018-11-01 ENCOUNTER — Emergency Department (HOSPITAL_COMMUNITY)
Admission: EM | Admit: 2018-11-01 | Discharge: 2018-11-01 | Disposition: A | Discharge status: Home / Self Care | Payer: Medicare HMO | Attending: Emergency Medicine | Admitting: Emergency Medicine

## 2018-11-01 ENCOUNTER — Encounter (HOSPITAL_COMMUNITY): Payer: Self-pay | Admitting: Emergency Medicine

## 2018-11-01 DIAGNOSIS — R0789 Other chest pain: Secondary | ICD-10-CM | POA: Diagnosis present

## 2018-11-01 DIAGNOSIS — Z5321 Procedure and treatment not carried out due to patient leaving prior to being seen by health care provider: Secondary | ICD-10-CM | POA: Diagnosis not present

## 2018-11-01 HISTORY — DX: Anxiety disorder, unspecified: F41.9

## 2018-11-01 LAB — BASIC METABOLIC PANEL
Anion gap: 12 (ref 5–15)
BUN: 13 mg/dL (ref 8–23)
CO2: 25 mmol/L (ref 22–32)
Calcium: 9.9 mg/dL (ref 8.9–10.3)
Chloride: 105 mmol/L (ref 98–111)
Creatinine, Ser: 0.97 mg/dL (ref 0.44–1.00)
GFR calc Af Amer: >60 mL/min (ref >60)
GFR calc non Af Amer: >60 mL/min (ref >60)
Glucose, Bld: 105 mg/dL — ABNORMAL HIGH (ref 70–99)
Potassium: 3.8 mmol/L (ref 3.5–5.1)
Sodium: 142 mmol/L (ref 135–145)

## 2018-11-01 LAB — TROPONIN I (HIGH SENSITIVITY): Troponin I (High Sensitivity): 5 ng/L (ref <18)

## 2018-11-01 LAB — CBC
HCT: 46.7 % — ABNORMAL HIGH (ref 36.0–46.0)
Hemoglobin: 14.9 g/dL (ref 12.0–15.0)
MCH: 30 pg (ref 26.0–34.0)
MCHC: 31.9 g/dL (ref 30.0–36.0)
MCV: 94.2 fL (ref 80.0–100.0)
Platelets: 278 10*3/uL (ref 150–400)
RBC: 4.96 MIL/uL (ref 3.87–5.11)
RDW: 12.9 % (ref 11.5–15.5)
WBC: 10 10*3/uL (ref 4.0–10.5)
nRBC: 0 % (ref 0.0–0.2)

## 2018-11-01 MED ORDER — SODIUM CHLORIDE 0.9% FLUSH: 3.0000 mL | Freq: Once | INTRAVENOUS | Status: DC

## 2018-11-01 NOTE — ED Notes (Signed)
Pt. Stated, I can not wait any longer . I have to leave before it gets dark.

## 2018-11-01 NOTE — ED Notes (Addendum)
[phone]- call pt when room is ready- Pt taken back into triage to have blood redrawn

## 2018-11-01 NOTE — ED Notes (Signed)
Pt's husband upstairs in heart unit, left to check on him. Will be back. Waiting on labs to come back. Orders late going in.   [phone]

## 2018-11-01 NOTE — ED Triage Notes (Signed)
Pt reports substernal CP while sitting in a chair upstairs visiting her husband who is admitted for CP surgery. Pt denies hx of heart dz. Takes medications for anxiety. Described sensation as choking, with headache. Denies SOB, weakness, radiation of pain. VSS. NAD at present.

## 2018-11-01 NOTE — ED Notes (Signed)
Pt. Is back from Heart unit visiting her husband, does not want to wait. Stated, I have to be home by dark. Explained the importance of waiting.  Pt. Stated, I will wait a little bit, but not very long.

## 2018-11-16 ENCOUNTER — Ambulatory Visit: Payer: Medicare HMO

## 2018-11-17 ENCOUNTER — Other Ambulatory Visit: Payer: Self-pay

## 2018-11-17 ENCOUNTER — Ambulatory Visit (INDEPENDENT_AMBULATORY_CARE_PROVIDER_SITE_OTHER): Payer: Medicare HMO

## 2018-11-17 DIAGNOSIS — Z23 Encounter for immunization: Secondary | ICD-10-CM

## 2018-11-17 NOTE — Progress Notes (Signed)
Patient came in today to receive her annual flu vaccine. Fluarix was given in the left deltoid. Patient tolerated well. VIS given

## 2018-12-19 ENCOUNTER — Other Ambulatory Visit: Payer: Medicare HMO

## 2018-12-19 ENCOUNTER — Other Ambulatory Visit: Payer: Self-pay | Admitting: Family Medicine

## 2018-12-19 ENCOUNTER — Other Ambulatory Visit: Payer: Self-pay

## 2018-12-19 DIAGNOSIS — Z0000 Encounter for general adult medical examination without abnormal findings: Secondary | ICD-10-CM

## 2018-12-19 DIAGNOSIS — Z13220 Encounter for screening for lipoid disorders: Secondary | ICD-10-CM | POA: Diagnosis not present

## 2018-12-20 ENCOUNTER — Other Ambulatory Visit: Payer: Self-pay

## 2018-12-20 LAB — CBC WITH DIFFERENTIAL/PLATELET
Absolute Monocytes: 475 cells/uL (ref 200–950)
Basophils Absolute: 37 cells/uL (ref 0–200)
Basophils Relative: 0.5 %
Eosinophils Absolute: 168 cells/uL (ref 15–500)
Eosinophils Relative: 2.3 %
HCT: 39.2 % (ref 35.0–45.0)
Hemoglobin: 12.7 g/dL (ref 11.7–15.5)
Lymphs Abs: 1986 cells/uL (ref 850–3900)
MCH: 29.8 pg (ref 27.0–33.0)
MCHC: 32.4 g/dL (ref 32.0–36.0)
MCV: 92 fL (ref 80.0–100.0)
MPV: 10.6 fL (ref 7.5–12.5)
Monocytes Relative: 6.5 %
Neutro Abs: 4636 cells/uL (ref 1500–7800)
Neutrophils Relative %: 63.5 %
Platelets: 208 10*3/uL (ref 140–400)
RBC: 4.26 10*6/uL (ref 3.80–5.10)
RDW: 12.1 % (ref 11.0–15.0)
Total Lymphocyte: 27.2 %
WBC: 7.3 10*3/uL (ref 3.8–10.8)

## 2018-12-20 LAB — COMPREHENSIVE METABOLIC PANEL
AG Ratio: 2.1 (calc) (ref 1.0–2.5)
ALT: 10 U/L (ref 6–29)
AST: 12 U/L (ref 10–35)
Albumin: 3.9 g/dL (ref 3.6–5.1)
Alkaline phosphatase (APISO): 48 U/L (ref 37–153)
BUN/Creatinine Ratio: 11 (calc) (ref 6–22)
BUN: 12 mg/dL (ref 7–25)
CO2: 28 mmol/L (ref 20–32)
Calcium: 9.1 mg/dL (ref 8.6–10.4)
Chloride: 110 mmol/L (ref 98–110)
Creat: 1.08 mg/dL — ABNORMAL HIGH (ref 0.50–0.99)
Globulin: 1.9 g/dL (calc) (ref 1.9–3.7)
Glucose, Bld: 85 mg/dL (ref 65–99)
Potassium: 4.2 mmol/L (ref 3.5–5.3)
Sodium: 146 mmol/L (ref 135–146)
Total Bilirubin: 0.6 mg/dL (ref 0.2–1.2)
Total Protein: 5.8 g/dL — ABNORMAL LOW (ref 6.1–8.1)

## 2018-12-20 LAB — LIPID PANEL
Cholesterol: 124 mg/dL (ref <200)
HDL: 47 mg/dL — ABNORMAL LOW (ref >=50)
LDL Cholesterol (Calc): 58 mg/dL (calc)
Non-HDL Cholesterol (Calc): 77 mg/dL (calc) (ref <130)
Total CHOL/HDL Ratio: 2.6 (calc) (ref <5.0)
Triglycerides: 107 mg/dL (ref <150)

## 2018-12-21 ENCOUNTER — Ambulatory Visit (INDEPENDENT_AMBULATORY_CARE_PROVIDER_SITE_OTHER): Payer: Medicare HMO | Admitting: Family Medicine

## 2018-12-21 ENCOUNTER — Encounter: Payer: Self-pay | Admitting: Family Medicine

## 2018-12-21 VITALS — BP 128/66 | HR 62 | Temp 98.2°F | Resp 16 | Ht 66.0 in | Wt 135.0 lb

## 2018-12-21 DIAGNOSIS — E785 Hyperlipidemia, unspecified: Secondary | ICD-10-CM | POA: Diagnosis not present

## 2018-12-21 DIAGNOSIS — H918X3 Other specified hearing loss, bilateral: Secondary | ICD-10-CM | POA: Diagnosis not present

## 2018-12-21 DIAGNOSIS — R69 Illness, unspecified: Secondary | ICD-10-CM | POA: Diagnosis not present

## 2018-12-21 DIAGNOSIS — Z0000 Encounter for general adult medical examination without abnormal findings: Secondary | ICD-10-CM

## 2018-12-21 DIAGNOSIS — F329 Major depressive disorder, single episode, unspecified: Secondary | ICD-10-CM

## 2018-12-21 DIAGNOSIS — M8580 Other specified disorders of bone density and structure, unspecified site: Secondary | ICD-10-CM | POA: Insufficient documentation

## 2018-12-21 DIAGNOSIS — Z0001 Encounter for general adult medical examination with abnormal findings: Secondary | ICD-10-CM

## 2018-12-21 DIAGNOSIS — F5104 Psychophysiologic insomnia: Secondary | ICD-10-CM | POA: Diagnosis not present

## 2018-12-21 DIAGNOSIS — F32A Depression, unspecified: Secondary | ICD-10-CM

## 2018-12-21 DIAGNOSIS — M8589 Other specified disorders of bone density and structure, multiple sites: Secondary | ICD-10-CM

## 2018-12-21 DIAGNOSIS — H9190 Unspecified hearing loss, unspecified ear: Secondary | ICD-10-CM | POA: Insufficient documentation

## 2018-12-21 DIAGNOSIS — F419 Anxiety disorder, unspecified: Secondary | ICD-10-CM

## 2018-12-21 NOTE — Patient Instructions (Addendum)
F/U 6 months  Schedule your bone density

## 2018-12-21 NOTE — Progress Notes (Signed)
Subjective:   Patient presents for Medicare Annual/Subsequent preventive examination.    Hyperlipidemia- taking statin lipitor    Husband recently had CABG    GAD- taking xanax and lexapro , no concerns feel good on the medication      Review Past Medical/Family/Social: Per EMR   Risk Factors  Current exercise habits: stays active/walks Dietary issues discussed: Yes  Cardiac risk factors: Hyperlipidemia   Depression Screen  (Note: if answer to either of the following is "Yes", a more complete depression screening is indicated)  Over the past two weeks, have you felt down, depressed or hopeless? No Over the past two weeks, have you felt little interest or pleasure in doing things? No Have you lost interest or pleasure in daily life? No Do you often feel hopeless? No Do you cry easily over simple problems? No   Activities of Daily Living  In your present state of health, do you have any difficulty performing the following activities?:  Driving? No  Managing money? No  Feeding yourself? No  Getting from bed to chair? No  Climbing a flight of stairs? No  Preparing food and eating?: No  Bathing or showering? No  Getting dressed: No  Getting to the toilet? No  Using the toilet:No  Moving around from place to place: No  In the past year have you fallen or had a near fall?:No  Are you sexually active? No  Do you have more than one partner? No   Hearing Difficulties: Yes, deaf in left ear  Do you often ask people to speak up or repeat themselves? Yes   Do you experience ringing or noises in your ears? No Do you have difficulty understanding soft or whispered voices? Yes Do you feel that you have a problem with memory? No Do you often misplace items? No  Do you feel safe at home? Yes  Cognitive Testing  Alert? Yes Normal Appearance?Yes  Oriented to person? Yes Place? Yes  Time? Yes  Recall of three objects? Yes  Can perform simple calculations? Yes  Displays  appropriate judgment?Yes  Can read the correct time from a watch face?Yes   List the Names of Other Physician/Practitioners you currently use: None currently     Screening Tests / Date Colonoscopy        - Unknown when last was, shoud be every 5 years, Dr. Penelope Coop              Shingles- Declines for now  Mammogram UTD Bone Density- UTD Influenza Vaccine  UTD Pneumonia- UTD  Tetanus/tdap UTD  ROS: GEN- denies fatigue, fever, weight loss,weakness, recent illness HEENT- denies eye drainage, change in vision, nasal discharge, CVS- denies chest pain, palpitations RESP- denies SOB, cough, wheeze ABD- denies N/V, change in stools, abd pain GU- denies dysuria, hematuria, dribbling, incontinence MSK- denies joint pain, muscle aches, injury Neuro- denies headache, dizziness, syncope, seizure activity   Physical: Vitals reviewed  GEN- NAD, alert and oriented x3 HEENT- PERRL, EOMI, non injected sclera, pink conjunctiva, MMM, oropharynx clear Neck- Supple, no thryomegaly, no bruit  CVS- RRR, no murmur RESP-CTAB ABD-NABS,soft,NT,ND EXT- Trace left ankle  edema Pulses- Radial, DP- 2+   Assessment:    Annual wellness medicare exam   Plan:    During the course of the visit the patient was educated and counseled about appropriate screening and preventive services including:    Fall/depression/audit C screening negative      Osteopenia- NOT taking calcium and vitamin D, bone density pt  to schedule    Hearing loss- does not want to pursue hearing aide evaluation at this time   MDD/Anxiety- doing well on current regimen , no change to meds   Hyperlipidemia- lipids at goal continue statin    Dicussed advanced directives- currently FULL CODE, handout given  Will f/u GI again, see when she needs colonoscopy          Diet review for nutrition referral? Yes ____ Not Indicated __x__  Patient Instructions (the written plan) was given to the patient.  Medicare Attestation  I  have personally reviewed:  The patient's medical and social history  Their use of alcohol, tobacco or illicit drugs  Their current medications and supplements  The patient's functional ability including ADLs,fall risks, home safety risks, cognitive, and hearing and visual impairment  Diet and physical activities  Evidence for depression or mood disorders  The patient's weight, height, BMI, and visual acuity have been recorded in the chart. I have made referrals, counseling, and provided education to the patient based on review of the above and I have provided the patient with a written personalized care plan for preventive services.

## 2019-01-02 ENCOUNTER — Other Ambulatory Visit: Payer: Self-pay | Admitting: Family Medicine

## 2019-01-02 NOTE — Telephone Encounter (Signed)
Last refilled: 07/27/2018 Last office visit: 12/21/2018

## 2019-03-19 ENCOUNTER — Other Ambulatory Visit: Payer: Self-pay | Admitting: Family Medicine

## 2019-03-22 ENCOUNTER — Other Ambulatory Visit: Payer: Self-pay | Admitting: Family Medicine

## 2019-04-09 ENCOUNTER — Ambulatory Visit: Payer: Self-pay

## 2019-04-11 ENCOUNTER — Encounter: Payer: Self-pay | Admitting: *Deleted

## 2019-05-15 ENCOUNTER — Other Ambulatory Visit: Payer: Self-pay | Admitting: Family Medicine

## 2019-05-15 NOTE — Telephone Encounter (Signed)
Ok to refill??  Last office visit 12/21/2018.  Last refill 01/02/2019, #2 refills.

## 2019-05-31 ENCOUNTER — Encounter: Payer: Self-pay | Admitting: Family Medicine

## 2019-05-31 ENCOUNTER — Ambulatory Visit (INDEPENDENT_AMBULATORY_CARE_PROVIDER_SITE_OTHER): Payer: Medicare HMO | Admitting: Family Medicine

## 2019-05-31 DIAGNOSIS — A084 Viral intestinal infection, unspecified: Secondary | ICD-10-CM | POA: Diagnosis not present

## 2019-05-31 NOTE — Progress Notes (Signed)
Virtual Visit via Telephone Note  I connected with Sonia Oliver on 05/31/19 at 11:20am by telephone and verified that I am speaking with the correct person using two identifiers.      Pt location: at home   Physician location:  In office, [facility], Vic Blackbird MD     On call: patient and physician   I discussed the limitations, risks, security and privacy concerns of performing an evaluation and management service by telephone and the availability of in person appointments. I also discussed with the patient that there may be a patient responsible charge related to this service. The patient expressed understanding and agreed to proceed.   History of Present Illness:  Telehealth vaccine due to URI symptoms. She has had both COVID-19 vaccines. This past Sunday after lunch, stated running a fever and chills. Temp was 100F orally. She then she had pain in left lower side of abdomen. Later had headache. She took Tylenol and fever broke and symptoms improved the next day. She had a few loose stools intermittently for the past couple of days, and yesterday and felt nauseated but never had vomiting She denies any URI symptoms She did have a little burning with urination on Sunday. Today pain had resolved. BM was normal    Observations/Objective: NAD noted on phone , normal speech and work of breathing   Assessment and Plan: Viral gastroenteritis now resolved, if pain or symptoms return will evaluate in office  Keep hydrated  Follow Up Instructions:    I discussed the assessment and treatment plan with the patient. The patient was provided an opportunity to ask questions and all were answered. The patient agreed with the plan and demonstrated an understanding of the instructions.   The patient was advised to call back or seek an in-person evaluation if the symptoms worsen or if the condition fails to improve as anticipated.  I provided  10 minutes of non-face-to-face  time during this encounter. End Time:11: 30  am   Vic Blackbird, MD

## 2019-09-15 ENCOUNTER — Other Ambulatory Visit: Payer: Self-pay | Admitting: Family Medicine

## 2019-09-17 ENCOUNTER — Other Ambulatory Visit: Payer: Self-pay | Admitting: Family Medicine

## 2019-09-27 ENCOUNTER — Other Ambulatory Visit: Payer: Self-pay | Admitting: Family Medicine

## 2019-09-27 NOTE — Telephone Encounter (Signed)
Ok to refill??  Last office visit 05/31/2019.  Last refill 05/15/2019, #2 refills.

## 2019-12-05 ENCOUNTER — Other Ambulatory Visit: Payer: Self-pay | Admitting: Family Medicine

## 2019-12-05 DIAGNOSIS — Z1231 Encounter for screening mammogram for malignant neoplasm of breast: Secondary | ICD-10-CM

## 2019-12-06 ENCOUNTER — Other Ambulatory Visit: Payer: Self-pay

## 2019-12-06 ENCOUNTER — Ambulatory Visit (INDEPENDENT_AMBULATORY_CARE_PROVIDER_SITE_OTHER): Payer: Medicare HMO

## 2019-12-06 DIAGNOSIS — Z23 Encounter for immunization: Secondary | ICD-10-CM | POA: Diagnosis not present

## 2019-12-29 ENCOUNTER — Other Ambulatory Visit: Payer: Self-pay

## 2019-12-29 ENCOUNTER — Ambulatory Visit
Admission: RE | Admit: 2019-12-29 | Discharge: 2019-12-29 | Disposition: A | Discharge status: Home / Self Care | Payer: Medicare HMO | Source: Ambulatory Visit | Attending: Family Medicine | Admitting: Family Medicine

## 2019-12-29 DIAGNOSIS — Z1231 Encounter for screening mammogram for malignant neoplasm of breast: Secondary | ICD-10-CM | POA: Diagnosis not present

## 2020-02-07 ENCOUNTER — Encounter: Payer: Self-pay | Admitting: Family Medicine

## 2020-02-07 ENCOUNTER — Ambulatory Visit (INDEPENDENT_AMBULATORY_CARE_PROVIDER_SITE_OTHER): Payer: Medicare HMO | Admitting: Family Medicine

## 2020-02-07 ENCOUNTER — Other Ambulatory Visit: Payer: Self-pay

## 2020-02-07 VITALS — BP 122/78 | HR 98 | Temp 98.2°F | Resp 14 | Ht 66.0 in | Wt 136.0 lb

## 2020-02-07 DIAGNOSIS — F419 Anxiety disorder, unspecified: Secondary | ICD-10-CM

## 2020-02-07 DIAGNOSIS — Z808 Family history of malignant neoplasm of other organs or systems: Secondary | ICD-10-CM | POA: Diagnosis not present

## 2020-02-07 DIAGNOSIS — L821 Other seborrheic keratosis: Secondary | ICD-10-CM | POA: Diagnosis not present

## 2020-02-07 DIAGNOSIS — F32A Depression, unspecified: Secondary | ICD-10-CM

## 2020-02-07 DIAGNOSIS — I781 Nevus, non-neoplastic: Secondary | ICD-10-CM | POA: Diagnosis not present

## 2020-02-07 DIAGNOSIS — R69 Illness, unspecified: Secondary | ICD-10-CM | POA: Diagnosis not present

## 2020-02-07 MED ORDER — ALPRAZOLAM 0.5 MG PO TABS: ORAL_TABLET | ORAL | 2 refills | Status: DC

## 2020-02-07 MED ORDER — ESCITALOPRAM OXALATE 20 MG PO TABS
20.0000 mg | ORAL_TABLET | Freq: Every day | ORAL | 1 refills | Status: DC
Start: 2020-02-07 — End: 2020-07-01

## 2020-02-07 NOTE — Patient Instructions (Addendum)
Referral to dermatology Medications refilled Schedule Physical - Janett Billow

## 2020-02-07 NOTE — Assessment & Plan Note (Signed)
Continue lexapro and xanax Doing well on regimen No changes  Pt to schedule CPE, will have labs done

## 2020-02-07 NOTE — Progress Notes (Signed)
   Subjective:    Patient ID: Sonia Oliver, female    DOB: 1950/04/16, 69 y.o.   MRN: [id number]  Patient presents for Neoplasm (Red spot on middle of back and new spot on R shoulder- shoulder spot itches and lower back has been painful in the past)   Pt here with spot on her right shoulder that itches for the past right and lower mid back for the past month   She has new spots on skin and she is concerned about Melanoma She has had COVID-19 vaccine done - doesn't have card with her  Has anxiety with major depression.  She feels like she is doing well on her current medications but does request a refill.  She has not had any side effects with the meds.  She is due for CPE with fasting labs  Wanted ears checked, feels pressure at times, no sinus drengage, facial pain, fever, cough  Review Of Systems:  GEN- denies fatigue, fever, weight loss,weakness, recent illness HEENT- denies eye drainage, change in vision, nasal discharge, CVS- denies chest pain, palpitations RESP- denies SOB, cough, wheeze ABD- denies N/V, change in stools, abd pain GU- denies dysuria, hematuria, dribbling, incontinence MSK- denies joint pain, muscle aches, injury Neuro- denies headache, dizziness, syncope, seizure activity       Objective:    BP 122/78   Pulse 98   Temp 98.2 F (36.8 C) (Temporal)   Resp 14   Ht 5\' 6"  (1.676 m)   Wt 136 lb (61.7 kg)   SpO2 99%   BMI 21.95 kg/m  GEN- NAD, alert and oriented x3 HEENT- PERRL, EOMI, non injected sclera, pink conjunctiva, MMM, oropharynx clear, TM clear no effusion  Neck- Supple, no LAD  CVS- RRR, no murmur RESP-CTAB Psych normal affect and mood Skin- seb keratosis on back, right shoulder blade bilat arms, senile angiomas on abd, lower back,age spots noted  EXT- No edema Pulses- Radial 2+        Assessment & Plan:      Problem List Items Addressed This Visit      Unprioritized   Anxiety and depression    Continue lexapro and xanax Doing  well on regimen No changes  Pt to schedule CPE, will have labs done      Relevant Medications   ALPRAZolam (XANAX) 0.5 MG tablet   escitalopram (LEXAPRO) 20 MG tablet    Other Visit Diagnoses    Seborrheic keratoses    -  Primary   Benign lesions, offered cyrotherapy today due to itching she declines, referral to derm to establish care for prevention, family history skin cancer   Relevant Orders   Ambulatory referral to Dermatology   Family history of melanoma       Relevant Orders   Ambulatory referral to Dermatology   Senile angioma          Note: This dictation was prepared with Dragon dictation along with smaller phrase technology. Any transcriptional errors that result from this process are unintentional.

## 2020-03-15 ENCOUNTER — Ambulatory Visit (HOSPITAL_COMMUNITY)
Admission: RE | Admit: 2020-03-15 | Discharge: 2020-03-15 | Disposition: A | Discharge status: Home / Self Care | Payer: Medicare HMO | Source: Ambulatory Visit | Attending: Nurse Practitioner | Admitting: Nurse Practitioner

## 2020-03-15 ENCOUNTER — Telehealth (INDEPENDENT_AMBULATORY_CARE_PROVIDER_SITE_OTHER): Payer: Medicare HMO | Admitting: Nurse Practitioner

## 2020-03-15 ENCOUNTER — Other Ambulatory Visit: Payer: Self-pay

## 2020-03-15 ENCOUNTER — Encounter: Payer: Self-pay | Admitting: Nurse Practitioner

## 2020-03-15 DIAGNOSIS — R042 Hemoptysis: Secondary | ICD-10-CM

## 2020-03-15 DIAGNOSIS — R040 Epistaxis: Secondary | ICD-10-CM | POA: Diagnosis not present

## 2020-03-15 HISTORY — DX: Hemoptysis: R04.2

## 2020-03-15 NOTE — Assessment & Plan Note (Signed)
Acute, ongoing x 1 week.  Patient concerned with history of colon cancer and current every day smoker x 50+ years for lung cancer.  Discussed with the patient possibility that phlegm with blood may be drainage from prior bloody nose.  However, given risk for lung cancer, will obtain chest x-ray imaging.  In future, consider that she may be candidate for yearly low dose CT given extensive smoking history.

## 2020-03-15 NOTE — Progress Notes (Signed)
Subjective:    Patient ID: Sonia Oliver, female    DOB: 1950/08/22, 70 y.o.   MRN: [id number]  HPI:  Sonia Oliver is a 70 y.o. female presenting virtually for coughing up blood.  Chief Complaint  Patient presents with  . Cough   UPPER RESPIRATORY TRACT INFECTION Has been coughing up specks of blood; seems like it is getting worse.  Earlier this week, was having a bit of blood draining from her nose when she blew her nose. Onset: this week Worst symptom: coughing up blood and bloody nose Fever: no Cough: yes; worse in the morning and noticed some blood Shortness of breath: no Wheezing: no Chest pain: no Chest tightness: no Chest congestion: no Nasal congestion: yes Runny nose: yes; this is not new for patient Post nasal drip: no Sneezing: no Sore throat: no Swollen glands: no Sinus pressure: no Headache: yes Face pain: no Toothache: no Ear pain: no  Ear pressure: no  Eyes red/itching:no Eye drainage/crusting: no  Nausea: no  Vomiting: no Diarrhea: no  Change in appetite: no  Loss of taste/smell: no  Rash: no Fatigue: no Sick contacts: no Strep contacts: no  Context: better Recurrent sinusitis: no Treatments attempted: Tylenol Relief with OTC medications: yes  Allergies  Allergen Reactions  . Codeine Shortness Of Breath  . Sulfa Antibiotics Nausea Only    Outpatient Encounter Medications as of 03/15/2020  Medication Sig  . ALPRAZolam (XANAX) 0.5 MG tablet TAKE 1 TABLET BY MOUTH TWICE A DAY AS NEEDED FOR ANXIETY  . aspirin EC 81 MG tablet Take 81 mg by mouth daily.  Marland Kitchen atorvastatin (LIPITOR) 20 MG tablet TAKE 1 TABLET BY MOUTH EVERY DAY  . escitalopram (LEXAPRO) 20 MG tablet Take 1 tablet (20 mg total) by mouth daily.  . fexofenadine (ALLEGRA) 180 MG tablet Take 1 tablet (180 mg total) by mouth daily.  . [DISCONTINUED] naproxen (NAPROSYN) 500 MG tablet Take 1 tablet (500 mg total) by mouth 2 (two) times daily.   No facility-administered encounter  medications on file as of 03/15/2020.    Patient Active Problem List   Diagnosis Date Noted  . Hemoptysis 03/15/2020  . Osteopenia 12/21/2018  . Hearing loss 12/21/2018  . Tobacco use 03/30/2017  . OA (osteoarthritis) of hip 03/20/2016  . Conversion disorder 03/20/2016  . Tinnitus 04/26/2015  . Chronic insomnia 08/01/2014  . Mild hyperlipidemia 08/01/2014  . Anxiety and depression 04/21/2014    Past Medical History:  Diagnosis Date  . Anxiety   . Cancer ([place]) 11/11/2009  . Deaf   . Hyperlipidemia   . Stroke [facility]) 04/19/2014    Relevant past medical, surgical, family and social history reviewed and updated as indicated. Interim medical history since our last visit reviewed.  Review of Systems Per HPI unless specifically indicated above     Objective:    There were no vitals taken for this visit.  Wt Readings from Last 3 Encounters:  02/07/20 136 lb (61.7 kg)  12/21/18 135 lb (61.2 kg)  11/01/18 135 lb (61.2 kg)    Physical Exam Physical examination unable to be performed due to telemedicine visit.  Patient talking in complete sentences during visit.   Results for orders placed or performed in visit on 04/11/19  HM COLONOSCOPY  Result Value Ref Range   HM Colonoscopy See Report (in chart) See Report (in chart), Patient Reported      Assessment & Plan:   Problem List Items Addressed This Visit  Other   Hemoptysis - Primary    Acute, ongoing x 1 week.  Patient concerned with history of colon cancer and current every day smoker x 50+ years for lung cancer.  Discussed with the patient possibility that phlegm with blood may be drainage from prior bloody nose.  However, given risk for lung cancer, will obtain chest x-ray imaging.  In future, consider that she may be candidate for yearly low dose CT given extensive smoking history.      Relevant Orders   DG Chest 2 View       Follow up plan: Return if symptoms worsen or fail to improve.  This visit was  completed via telephone due to the restrictions of the COVID-19 pandemic. All issues as above were discussed and addressed but no physical exam was performed. If it was felt that the patient should be evaluated in the office, they were directed there. The patient verbally consented to this visit. Patient was unable to complete an audio/visual visit due to Lack of equipment. . Location of the patient: home . Location of the provider: work . Those involved with this call:  . Provider: Carnella Guadalajara, DNP . CMA: none . Front Desk/Registration: Santina Evans  . Time spent on call: 15 minutes on the phone discussing health concerns. 30 minutes total spent in review of patient's record and preparation of their chart.  I verified patient identity using two factors (patient name and date of birth). Patient consents verbally to being seen via telemedicine visit today.

## 2020-03-15 NOTE — Patient Instructions (Signed)
Nosebleed, Adult A nosebleed is when blood comes out of the nose. Nosebleeds are common and can be caused by many things. They are usually not a sign of a serious medical problem. Follow these instructions at home: When you have a nosebleed:  Sit down.  Tilt your head a little forward.  Follow these steps: 1. Pinch your nose with a clean towel or tissue. 2. Keep pinching your nose for 5 minutes. Do not let go. 3. After 5 minutes, let go of your nose. 4. If there is still bleeding, do these steps again. Keep doing these steps until the bleeding stops.  Do not put tissues or other things in your nose to stop the bleeding.  Avoid lying down or putting your head back.  Use a nose spray decongestant as told by your doctor.   After a nosebleed:  Try not to blow your nose or sniffle for several hours.  Try not to strain, lift, or bend at the waist for several days.  Aspirin and blood-thinning medicines make bleeding more likely. If you take these medicines: ? Ask your doctor if you should stop taking them or if you should change how much you take. ? Do not stop taking the medicine unless your doctor tells you to.  If your nosebleed was caused by dryness, use over-the-counter saline nasal spray or gel and humidifier as told by your doctor. This will keep the inside of your nose moist and allow it to heal. If you need to use one of these products: ? Choose one that is water-soluble. ? Use only as much as you need and use it only as often as needed. ? Do not lie down right away after you use it.  If you get nosebleeds often, talk with your doctor about treatments. These may include: ? Nasal cautery. A chemical swab or electrical device is used to lightly burn tiny blood vessels inside the nose. This helps stop or prevent nosebleeds. ? Nasal packing. A gauze or other material is placed in the nose to keep constant pressure on the bleeding area. Contact a doctor if:  You have a  fever.  You get nosebleeds often.  You are getting nosebleeds more often than usual.  You bruise very easily.  You have something stuck in your nose.  You have bleeding in your mouth.  You vomit or cough up brown material.  You get a nosebleed after you start a new medicine. Get help right away if:  You have a nosebleed after you fall or hurt your head.  Your nosebleed does not go away after 20 minutes.  You feel dizzy or weak.  You have unusual bleeding from other parts of your body.  You have unusual bruising on other parts of your body.  You get sweaty.  You vomit blood. Summary  Nosebleeds are common. They are usually not a sign of a serious medical problem.  When you have a nosebleed, sit down and tilt your head a little forward. Pinch your nose with a clean tissue for 5 minutes.  Use saline spray or saline gel and a humidifier as told by your doctor.  Get help right away if your nosebleed does not go away after 20 minutes. This information is not intended to replace advice given to you by your health care provider. Make sure you discuss any questions you have with your health care provider. Document Revised: 12/08/2018 Document Reviewed: 12/08/2018 Elsevier Patient Education  2021 [employer]  

## 2020-03-16 ENCOUNTER — Other Ambulatory Visit: Payer: Self-pay | Admitting: Family Medicine

## 2020-03-20 DIAGNOSIS — R040 Epistaxis: Secondary | ICD-10-CM | POA: Diagnosis not present

## 2020-03-20 DIAGNOSIS — R042 Hemoptysis: Secondary | ICD-10-CM | POA: Diagnosis not present

## 2020-03-20 DIAGNOSIS — R69 Illness, unspecified: Secondary | ICD-10-CM | POA: Diagnosis not present

## 2020-03-20 DIAGNOSIS — Z85038 Personal history of other malignant neoplasm of large intestine: Secondary | ICD-10-CM | POA: Diagnosis not present

## 2020-03-21 DIAGNOSIS — Z23 Encounter for immunization: Secondary | ICD-10-CM | POA: Diagnosis not present

## 2020-03-22 DIAGNOSIS — Z85038 Personal history of other malignant neoplasm of large intestine: Secondary | ICD-10-CM | POA: Diagnosis not present

## 2020-03-25 ENCOUNTER — Other Ambulatory Visit: Payer: Self-pay | Admitting: Physician Assistant

## 2020-03-25 DIAGNOSIS — R042 Hemoptysis: Secondary | ICD-10-CM

## 2020-04-08 ENCOUNTER — Ambulatory Visit
Admission: RE | Admit: 2020-04-08 | Discharge: 2020-04-08 | Disposition: A | Discharge status: Home / Self Care | Payer: Medicare HMO | Source: Ambulatory Visit | Attending: Physician Assistant | Admitting: Physician Assistant

## 2020-04-08 ENCOUNTER — Other Ambulatory Visit: Payer: Self-pay

## 2020-04-08 DIAGNOSIS — R042 Hemoptysis: Secondary | ICD-10-CM

## 2020-04-08 DIAGNOSIS — M4184 Other forms of scoliosis, thoracic region: Secondary | ICD-10-CM | POA: Diagnosis not present

## 2020-04-08 DIAGNOSIS — J439 Emphysema, unspecified: Secondary | ICD-10-CM | POA: Diagnosis not present

## 2020-04-08 DIAGNOSIS — J984 Other disorders of lung: Secondary | ICD-10-CM | POA: Diagnosis not present

## 2020-04-08 DIAGNOSIS — Z85038 Personal history of other malignant neoplasm of large intestine: Secondary | ICD-10-CM | POA: Diagnosis not present

## 2020-04-08 MED ORDER — IOPAMIDOL (ISOVUE-300) INJECTION 61%
75.0000 mL | Freq: Once | INTRAVENOUS | Status: AC | PRN
  Administered 2020-04-08: 75 mL via INTRAVENOUS

## 2020-04-29 DIAGNOSIS — Z01812 Encounter for preprocedural laboratory examination: Secondary | ICD-10-CM | POA: Diagnosis not present

## 2020-05-02 DIAGNOSIS — R195 Other fecal abnormalities: Secondary | ICD-10-CM | POA: Diagnosis not present

## 2020-05-02 DIAGNOSIS — Z85038 Personal history of other malignant neoplasm of large intestine: Secondary | ICD-10-CM | POA: Diagnosis not present

## 2020-05-02 DIAGNOSIS — Z86010 Personal history of colonic polyps: Secondary | ICD-10-CM | POA: Diagnosis not present

## 2020-05-02 DIAGNOSIS — D124 Benign neoplasm of descending colon: Secondary | ICD-10-CM | POA: Diagnosis not present

## 2020-05-02 DIAGNOSIS — K5730 Diverticulosis of large intestine without perforation or abscess without bleeding: Secondary | ICD-10-CM | POA: Diagnosis not present

## 2020-05-02 DIAGNOSIS — D125 Benign neoplasm of sigmoid colon: Secondary | ICD-10-CM | POA: Diagnosis not present

## 2020-05-02 DIAGNOSIS — K317 Polyp of stomach and duodenum: Secondary | ICD-10-CM | POA: Diagnosis not present

## 2020-05-02 DIAGNOSIS — K921 Melena: Secondary | ICD-10-CM | POA: Diagnosis not present

## 2020-05-02 DIAGNOSIS — D122 Benign neoplasm of ascending colon: Secondary | ICD-10-CM | POA: Diagnosis not present

## 2020-05-02 DIAGNOSIS — D123 Benign neoplasm of transverse colon: Secondary | ICD-10-CM | POA: Diagnosis not present

## 2020-05-08 DIAGNOSIS — D125 Benign neoplasm of sigmoid colon: Secondary | ICD-10-CM | POA: Diagnosis not present

## 2020-05-08 DIAGNOSIS — D124 Benign neoplasm of descending colon: Secondary | ICD-10-CM | POA: Diagnosis not present

## 2020-05-08 DIAGNOSIS — D122 Benign neoplasm of ascending colon: Secondary | ICD-10-CM | POA: Diagnosis not present

## 2020-05-08 DIAGNOSIS — K317 Polyp of stomach and duodenum: Secondary | ICD-10-CM | POA: Diagnosis not present

## 2020-05-08 DIAGNOSIS — D123 Benign neoplasm of transverse colon: Secondary | ICD-10-CM | POA: Diagnosis not present

## 2020-06-20 ENCOUNTER — Other Ambulatory Visit: Payer: Self-pay | Admitting: Family Medicine

## 2020-06-20 NOTE — Telephone Encounter (Signed)
Pt called needing a refill of ALPRAZolam (XANAX) 0.5 MG tablet  She does have an appt scheduled with NP, but wanted to see if she could get this filled before.  Cb#: [phone]

## 2020-06-24 ENCOUNTER — Other Ambulatory Visit: Payer: Self-pay

## 2020-06-24 ENCOUNTER — Other Ambulatory Visit: Payer: Medicare HMO

## 2020-06-24 DIAGNOSIS — E785 Hyperlipidemia, unspecified: Secondary | ICD-10-CM | POA: Diagnosis not present

## 2020-06-24 LAB — CBC WITH DIFFERENTIAL/PLATELET
Absolute Monocytes: 331 cells/uL (ref 200–950)
Basophils Absolute: 41 cells/uL (ref 0–200)
Basophils Relative: 0.7 %
Eosinophils Absolute: 139 cells/uL (ref 15–500)
Eosinophils Relative: 2.4 %
HCT: 41.8 % (ref 35.0–45.0)
Hemoglobin: 13.8 g/dL (ref 11.7–15.5)
Lymphs Abs: 1653 cells/uL (ref 850–3900)
MCH: 30.3 pg (ref 27.0–33.0)
MCHC: 33 g/dL (ref 32.0–36.0)
MCV: 91.7 fL (ref 80.0–100.0)
MPV: 10.5 fL (ref 7.5–12.5)
Monocytes Relative: 5.7 %
Neutro Abs: 3637 cells/uL (ref 1500–7800)
Neutrophils Relative %: 62.7 %
Platelets: 236 10*3/uL (ref 140–400)
RBC: 4.56 10*6/uL (ref 3.80–5.10)
RDW: 12.5 % (ref 11.0–15.0)
Total Lymphocyte: 28.5 %
WBC: 5.8 10*3/uL (ref 3.8–10.8)

## 2020-06-24 LAB — LIPID PANEL
Cholesterol: 133 mg/dL (ref <200)
HDL: 44 mg/dL — ABNORMAL LOW (ref >=50)
LDL Cholesterol (Calc): 72 mg/dL (calc)
Non-HDL Cholesterol (Calc): 89 mg/dL (calc) (ref <130)
Total CHOL/HDL Ratio: 3 (calc) (ref <5.0)
Triglycerides: 91 mg/dL (ref <150)

## 2020-06-24 LAB — COMPLETE METABOLIC PANEL WITH GFR
AG Ratio: 1.7 (calc) (ref 1.0–2.5)
ALT: 8 U/L (ref 6–29)
AST: 11 U/L (ref 10–35)
Albumin: 4 g/dL (ref 3.6–5.1)
Alkaline phosphatase (APISO): 55 U/L (ref 37–153)
BUN: 10 mg/dL (ref 7–25)
CO2: 29 mmol/L (ref 20–32)
Calcium: 9.2 mg/dL (ref 8.6–10.4)
Chloride: 107 mmol/L (ref 98–110)
Creat: 0.93 mg/dL (ref 0.60–0.93)
GFR, Est African American: 72 mL/min/{1.73_m2} (ref >=60)
GFR, Est Non African American: 62 mL/min/{1.73_m2} (ref >=60)
Globulin: 2.3 g/dL (calc) (ref 1.9–3.7)
Glucose, Bld: 101 mg/dL — ABNORMAL HIGH (ref 65–99)
Potassium: 4.4 mmol/L (ref 3.5–5.3)
Sodium: 144 mmol/L (ref 135–146)
Total Bilirubin: 0.6 mg/dL (ref 0.2–1.2)
Total Protein: 6.3 g/dL (ref 6.1–8.1)

## 2020-06-24 LAB — TSH: TSH: 2.59 mIU/L (ref 0.40–4.50)

## 2020-06-25 ENCOUNTER — Other Ambulatory Visit: Payer: Self-pay

## 2020-06-25 MED ORDER — ALPRAZOLAM 0.5 MG PO TABS: ORAL_TABLET | ORAL | 2 refills | Status: DC

## 2020-06-25 NOTE — Telephone Encounter (Signed)
Received call from patient.   Reports that she is completely out of medication and pharmacy reports that fax has been sent multiple times.  Patient is aware that Janett Billow will be handling her refills in the future, but states that she really needs her refill today.   Ok to refill??  Last office visit/ refill 02/07/2020, #2 refills.

## 2020-07-01 ENCOUNTER — Ambulatory Visit (INDEPENDENT_AMBULATORY_CARE_PROVIDER_SITE_OTHER): Payer: Medicare HMO | Admitting: Nurse Practitioner

## 2020-07-01 ENCOUNTER — Encounter: Payer: Self-pay | Admitting: Nurse Practitioner

## 2020-07-01 ENCOUNTER — Other Ambulatory Visit: Payer: Self-pay

## 2020-07-01 VITALS — BP 128/70 | HR 60 | Temp 98.3°F | Ht 66.0 in | Wt 134.6 lb

## 2020-07-01 DIAGNOSIS — F32A Depression, unspecified: Secondary | ICD-10-CM

## 2020-07-01 DIAGNOSIS — F419 Anxiety disorder, unspecified: Secondary | ICD-10-CM | POA: Diagnosis not present

## 2020-07-01 DIAGNOSIS — E785 Hyperlipidemia, unspecified: Secondary | ICD-10-CM | POA: Diagnosis not present

## 2020-07-01 DIAGNOSIS — Z0001 Encounter for general adult medical examination with abnormal findings: Secondary | ICD-10-CM

## 2020-07-01 DIAGNOSIS — R69 Illness, unspecified: Secondary | ICD-10-CM | POA: Diagnosis not present

## 2020-07-01 DIAGNOSIS — Z79899 Other long term (current) drug therapy: Secondary | ICD-10-CM

## 2020-07-01 DIAGNOSIS — Z0000 Encounter for general adult medical examination without abnormal findings: Secondary | ICD-10-CM

## 2020-07-01 MED ORDER — ESCITALOPRAM OXALATE 20 MG PO TABS: 20.0000 mg | ORAL_TABLET | Freq: Every day | ORAL | 1 refills | Status: DC

## 2020-07-01 MED ORDER — ATORVASTATIN CALCIUM 20 MG PO TABS: 1.0000 | ORAL_TABLET | Freq: Every day | ORAL | 1 refills | Status: DC

## 2020-07-01 NOTE — Progress Notes (Signed)
Patient: Sonia Oliver, Female    DOB: 09-Feb-1951, 70 y.o.   MRN: [id number]  Visit Date: 07/01/2020  Today's Provider: Eulogio Bear, NP   Chief Complaint  Patient presents with  . Medicare Wellness    Gad7 and phq 9 completed, will give advance care directive information    Subjective:   DEMECIA Oliver is a 70 y.o. female who presents today for her Subsequent Annual Wellness Visit.  Care team: Primary care -  [facility] - [facility] - Deliah Goody  HPI  Anxiety and depression - tries not to stress; thinks day to day mood is stable.  Takes one alprazolam at night time.  She had run out recently and could only toss and turn.  She also takes lexapro 20 mg daily.  Depression screen [facility] 2/9 07/01/2020 07/01/2020 12/21/2018 01/17/2018 07/02/2017  Decreased Interest 0 0 0 0 0  Down, Depressed, Hopeless 0 0 0 0 0  PHQ - 2 Score 0 0 0 0 0  Altered sleeping 1 1 - - -  Tired, decreased energy 1 1 - - -  Change in appetite 0 0 - - -  Feeling bad or failure about yourself  0 - - - -  Trouble concentrating 0 1 - - -  Moving slowly or fidgety/restless 0 0 - - -  Suicidal thoughts 0 0 - - -  PHQ-9 Score 2 3 - - -  Difficult doing work/chores Not difficult at all Not difficult at all - - -   GAD 7 : Generalized Anxiety Score 07/01/2020  Nervous, Anxious, on Edge 1  Control/stop worrying 1  Worry too much - different things 1  Trouble relaxing 1  Restless 0  Easily annoyed or irritable 1  Afraid - awful might happen 0  Total GAD 7 Score 5  Anxiety Difficulty Not difficult at all    Hearing loss - seeing Audiologist later this week to discuss possible hearing aid.  Hyperlipidemia - takes atorvastatin 20 mg daily faithfully.  No issues.  Review of Systems  Constitutional: Negative.   HENT: Positive for hearing loss and tinnitus. Negative for congestion, ear discharge, ear pain, sinus pressure, sinus pain, sneezing, sore throat and voice  change.   Eyes: Negative.   Respiratory: Negative.   Cardiovascular: Negative.   Gastrointestinal: Negative.   Genitourinary: Negative.   Musculoskeletal: Negative.   Skin: Negative.   Neurological: Negative.   Psychiatric/Behavioral: Negative.     Past Medical History:  Diagnosis Date  . Anxiety   . Cancer ([place]) 11/11/2009  . Deaf   . Hyperlipidemia   . Stroke [facility]) 04/19/2014   Past Surgical History:  Procedure Laterality Date  . ABDOMINAL HYSTERECTOMY    . BREAST CYST EXCISION Left   . COLON SURGERY      Family History  Problem Relation Age of Onset  . Arthritis Mother   . Diabetes Mother   . Stroke Mother   . Early death Father   . Heart disease Father   . Arthritis Sister   . Hyperlipidemia Sister   . Hypertension Sister   . Early death Sister   . Depression Sister   . Drug abuse Sister   . Breast cancer Sister        40's or ealy 60's  . Hyperlipidemia Brother   . Hypertension Brother   . Early death Brother   . Arthritis Sister   . Hyperlipidemia Sister   . Hypertension Sister   .  Depression Sister   . Drug abuse Sister   . Arthritis Sister   . Hyperlipidemia Sister   . Hypertension Sister   . Depression Sister   . Drug abuse Sister   . Breast cancer Maternal Aunt     Social History   Socioeconomic History  . Marital status: Married    Spouse name: Not on file  . Number of children: Not on file  . Years of education: Not on file  . Highest education level: Not on file  Occupational History  . Not on file  Tobacco Use  . Smoking status: Current Every Day Smoker    Packs/day: 0.50    Types: Cigarettes    Start date: 02/24/1963  . Smokeless tobacco: Never Used  Substance and Sexual Activity  . Alcohol use: No  . Drug use: No  . Sexual activity: Yes    Birth control/protection: Surgical  Other Topics Concern  . Not on file  Social History Narrative   ** Merged History Encounter **       Social Determinants of Health   Financial  Resource Strain: Not on file  Food Insecurity: Not on file  Transportation Needs: Not on file  Physical Activity: Not on file  Stress: Not on file  Social Connections: Not on file  Intimate Partner Violence: Not on file    Outpatient Encounter Medications as of 07/01/2020  Medication Sig  . ALPRAZolam (XANAX) 0.5 MG tablet TAKE 1 TABLET BY MOUTH TWICE A DAY AS NEEDED FOR ANXIETY  . aspirin EC 81 MG tablet Take 81 mg by mouth daily.  . fexofenadine (ALLEGRA) 180 MG tablet Take 1 tablet (180 mg total) by mouth daily.  . [DISCONTINUED] atorvastatin (LIPITOR) 20 MG tablet TAKE 1 TABLET BY MOUTH EVERY DAY  . [DISCONTINUED] escitalopram (LEXAPRO) 20 MG tablet Take 1 tablet (20 mg total) by mouth daily.  Marland Kitchen atorvastatin (LIPITOR) 20 MG tablet Take 1 tablet (20 mg total) by mouth daily.  Marland Kitchen escitalopram (LEXAPRO) 20 MG tablet Take 1 tablet (20 mg total) by mouth daily.   No facility-administered encounter medications on file as of 07/01/2020.   Functional Status Survey: Is the patient deaf or have difficulty hearing?: Yes (deaf in left ear; has appt Friday with Audiologist) Does the patient have difficulty seeing, even when wearing glasses/contacts?: No Does the patient have difficulty concentrating, remembering, or making decisions?: No Does the patient have difficulty walking or climbing stairs?: No Does the patient have difficulty dressing or bathing?: No Does the patient have difficulty doing errands alone such as visiting a doctor's office or shopping?: No  Fall Risk Assessment Fall Risk  07/01/2020 12/21/2018 01/17/2018 07/02/2017 03/30/2017  Falls in the past year? 0 0 0 No No  Number falls in past yr: 0 - - - -  Injury with Fall? 0 - - - -  Follow up - Falls evaluation completed Falls evaluation completed - -   Depression Screen Depression screen [facility] 2/9 07/01/2020 07/01/2020 12/21/2018 01/17/2018 07/02/2017  Decreased Interest 0 0 0 0 0  Down, Depressed, Hopeless 0 0 0 0 0  PHQ - 2 Score 0 0  0 0 0  Altered sleeping 1 1 - - -  Tired, decreased energy 1 1 - - -  Change in appetite 0 0 - - -  Feeling bad or failure about yourself  0 - - - -  Trouble concentrating 0 1 - - -  Moving slowly or fidgety/restless 0 0 - - -  Suicidal thoughts 0 0 - - -  PHQ-9 Score 2 3 - - -  Difficult doing work/chores Not difficult at all Not difficult at all - - -   6CIT Screen 07/01/2020 07/01/2020  What Year? 0 points 0 points  What month? 0 points 0 points  What time? 0 points 0 points  Count back from 20 0 points 0 points  Months in reverse 0 points 0 points  Repeat phrase 2 points 0 points  Total Score 2 0   Advanced Directives Does patient have a HCPOA?    no If yes, name and contact information:  Does patient have a living will or MOST form?  no; discussed importance and advanced directive packet given to patient.  Objective:   Vitals: BP 128/70   Pulse 60   Temp 98.3 F (36.8 C)   Ht 5\' 6"  (1.676 m)   Wt 134 lb 9.6 oz (61.1 kg)   SpO2 98%   BMI 21.73 kg/m  Body mass index is 21.73 kg/m. No exam data present  Physical Exam Vitals and nursing note reviewed.  Constitutional:      General: She is not in acute distress.    Appearance: Normal appearance. She is not toxic-appearing.  HENT:     Head: Normocephalic and atraumatic.     Right Ear: Tympanic membrane, ear canal and external ear normal.     Left Ear: Tympanic membrane, ear canal and external ear normal.     Nose: Nose normal. No congestion.     Mouth/Throat:     Mouth: Mucous membranes are moist.     Pharynx: Oropharynx is clear. No oropharyngeal exudate or posterior oropharyngeal erythema.  Eyes:     General: No scleral icterus.    Extraocular Movements: Extraocular movements intact.     Pupils: Pupils are equal, round, and reactive to light.  Cardiovascular:     Rate and Rhythm: Normal rate and regular rhythm.     Heart sounds: Normal heart sounds. No murmur heard.   Pulmonary:     Effort: Pulmonary effort  is normal. No respiratory distress.     Breath sounds: Normal breath sounds. No wheezing or rales.  Abdominal:     General: Abdomen is flat. Bowel sounds are normal. There is no distension.     Palpations: Abdomen is soft. There is no mass.     Tenderness: There is no right CVA tenderness or left CVA tenderness.  Musculoskeletal:        General: Normal range of motion.     Cervical back: Normal range of motion and neck supple.     Right lower leg: No edema.     Left lower leg: No edema.  Lymphadenopathy:     Cervical: No cervical adenopathy.  Skin:    General: Skin is warm and dry.     Capillary Refill: Capillary refill takes less than 2 seconds.     Coloration: Skin is not jaundiced or pale.     Findings: No erythema.  Neurological:     Mental Status: She is alert and oriented to person, place, and time.     Gait: Gait normal.  Psychiatric:        Mood and Affect: Mood normal.        Behavior: Behavior normal.        Thought Content: Thought content normal.        Judgment: Judgment normal.      Assessment & Plan:     Annual  Wellness Visit  1. Encounter for initial annual wellness visit (AWV) in Medicare patient   2. Controlled substance agreement signed For alprazolam 0.5 mg.  Takes 1 tablet nightly for insomnia.  Does not need refill today, current refill should last until then.  Follow up in 3 months.  3. Anxiety and depression Chronic.  GAD-7 and PHQ-9 stable today.  Will not make changes in regimen. Recent labs were within normal limits.  Follow up in 6 months.  - escitalopram (LEXAPRO) 20 MG tablet; Take 1 tablet (20 mg total) by mouth daily.  Dispense: 90 tablet; Refill: 1  4. Mild hyperlipidemia Chronic.  Recent LDL less than 100.  Continue atorvastatin 20 mg daily for now.  Follow up in 6 months.  - atorvastatin (LIPITOR) 20 MG tablet; Take 1 tablet (20 mg total) by mouth daily.  Dispense: 90 tablet; Refill: 1   Reviewed patient's Family Medical  History  Reviewed and updated list of patient's medical providers  Assessment of cognitive impairment was done  Assessed patient's functional ability  Established a written schedule for health screening [place] Completed and Reviewed  Immunization History  Administered Date(s) Administered  . Fluad Quad(high Dose 65+) 12/06/2019  . Influenza, High Dose Seasonal PF 12/09/2016  . Influenza,inj,Quad PF,6+ Mos 11/27/2015, 11/17/2018  . Influenza-Unspecified 11/23/2013, 10/08/2017  . Moderna SARS-COV2 Booster Vaccination 03/21/2020  . Moderna Sars-Covid-2 Vaccination 04/06/2019, 05/05/2019  . Pneumococcal Conjugate-13 04/26/2015  . Pneumococcal Polysaccharide-23 11/14/2009, 04/27/2016  . Tdap 12/09/2016    Health Maintenance  Topic Date Due  . COVID-19 Vaccine (4 - Booster for Moderna series) 09/18/2020  . INFLUENZA VACCINE  09/23/2020  . MAMMOGRAM  12/28/2021  . COLONOSCOPY (Pts 45-105yrs Insurance coverage will need to be confirmed)  06/19/2025  . TETANUS/TDAP  12/10/2026  . DEXA SCAN  Completed  . Hepatitis C Screening  Completed  . PNA vac Low Risk Adult  Completed  . HPV VACCINES  Aged Out    Discussed health benefits of physical activity, and encouraged her to engage in regular exercise appropriate for her age and condition.   Meds ordered this encounter  Medications  . escitalopram (LEXAPRO) 20 MG tablet    Sig: Take 1 tablet (20 mg total) by mouth daily.    Dispense:  90 tablet    Refill:  1    Order Specific Question:   Supervising Provider    Answer:   Jenna Luo T [[id number]]  . atorvastatin (LIPITOR) 20 MG tablet    Sig: Take 1 tablet (20 mg total) by mouth daily.    Dispense:  90 tablet    Refill:  1    Order Specific Question:   Supervising Provider    Answer:   Jenna Luo T [[id number]]    Current Outpatient Medications:  .  ALPRAZolam (XANAX) 0.5 MG tablet, TAKE 1 TABLET BY MOUTH TWICE A DAY AS NEEDED FOR ANXIETY, Disp: 45  tablet, Rfl: 2 .  aspirin EC 81 MG tablet, Take 81 mg by mouth daily., Disp: , Rfl:  .  fexofenadine (ALLEGRA) 180 MG tablet, Take 1 tablet (180 mg total) by mouth daily., Disp: 90 tablet, Rfl: 1 .  atorvastatin (LIPITOR) 20 MG tablet, Take 1 tablet (20 mg total) by mouth daily., Disp: 90 tablet, Rfl: 1 .  escitalopram (LEXAPRO) 20 MG tablet, Take 1 tablet (20 mg total) by mouth daily., Disp: 90 tablet, Rfl: 1 Medications Discontinued During This Encounter  Medication Reason  . escitalopram (LEXAPRO) 20 MG  tablet Reorder  . atorvastatin (LIPITOR) 20 MG tablet Reorder    Next Medicare Wellness Visit in 12+ months

## 2020-09-27 ENCOUNTER — Other Ambulatory Visit: Payer: Self-pay

## 2020-09-27 ENCOUNTER — Telehealth: Payer: Self-pay | Admitting: Nurse Practitioner

## 2020-09-27 NOTE — Telephone Encounter (Signed)
Patient tested positive for COVID and wants to know if she'll be able to come in for her next appointment with the provider scheduled for 8/10. Please advise at [phone].

## 2020-09-30 NOTE — Telephone Encounter (Signed)
Spoke with pt, pt knows not to come into the office for now. Appt virtual

## 2020-10-02 ENCOUNTER — Encounter: Payer: Self-pay | Admitting: Nurse Practitioner

## 2020-10-02 ENCOUNTER — Other Ambulatory Visit: Payer: Self-pay

## 2020-10-02 ENCOUNTER — Telehealth (INDEPENDENT_AMBULATORY_CARE_PROVIDER_SITE_OTHER): Payer: Medicare HMO | Admitting: Nurse Practitioner

## 2020-10-02 DIAGNOSIS — Z79899 Other long term (current) drug therapy: Secondary | ICD-10-CM | POA: Diagnosis not present

## 2020-10-02 DIAGNOSIS — F32A Depression, unspecified: Secondary | ICD-10-CM | POA: Diagnosis not present

## 2020-10-02 DIAGNOSIS — F419 Anxiety disorder, unspecified: Secondary | ICD-10-CM | POA: Diagnosis not present

## 2020-10-02 DIAGNOSIS — Z720 Tobacco use: Secondary | ICD-10-CM | POA: Diagnosis not present

## 2020-10-02 DIAGNOSIS — E785 Hyperlipidemia, unspecified: Secondary | ICD-10-CM

## 2020-10-02 DIAGNOSIS — R69 Illness, unspecified: Secondary | ICD-10-CM | POA: Diagnosis not present

## 2020-10-02 DIAGNOSIS — F5104 Psychophysiologic insomnia: Secondary | ICD-10-CM

## 2020-10-02 MED ORDER — ALPRAZOLAM 0.5 MG PO TABS: 0.5000 mg | ORAL_TABLET | Freq: Every evening | ORAL | 2 refills | Status: DC | PRN

## 2020-10-02 MED ORDER — ATORVASTATIN CALCIUM 20 MG PO TABS: 20.0000 mg | ORAL_TABLET | Freq: Every day | ORAL | 1 refills | Status: DC

## 2020-10-02 MED ORDER — ESCITALOPRAM OXALATE 20 MG PO TABS: 20.0000 mg | ORAL_TABLET | Freq: Every day | ORAL | 1 refills | Status: DC

## 2020-10-02 NOTE — Assessment & Plan Note (Signed)
Chronic, stable.  PDMP reviewed and appropriate. Pt is aware of risks of psychoactive medication use to include increased sedation, respiratory suppression, falls, extrapyramidal movements,  dependence and cardiovascular events.  Pt would like to continue treatment as benefit determined to outweigh risk.  Will refill alprazolam 0.5 mg nightly as needed for sleep.  Will need UDS at next visit.  Controlled substance agreement has been signed in the past.

## 2020-10-02 NOTE — Progress Notes (Signed)
Subjective:    Patient ID: Sonia Oliver, female    DOB: 1951/02/23, 70 y.o.   MRN: [id number]  HPI: Sonia Oliver is a 70 y.o. female presenting for follow up.  Chief Complaint  Patient presents with   Insomnia    3 month follow up, Taking xanax for sleep for sleep, nx refill due 8/29.   Has been around people who have COVID.  She is trying to be away from people.  Somebody at a funeral recently had COVID-19.  She is not having any symptoms today, but will test and has at home tests if she develops symptoms.  INSOMNIA Well-controlled with alprazolam 0.5 mg nightly at bedtime as needed for sleep.  No issues with falls, daytime hypersomnolence, dizziness.  TOBACCO USER Smoking Status: Current smoker Smoking Amount: About 1/2 to 3/4 pack/day Smoking Onset: More than 40 years ago Interested in smoking cessation: Not today  MOOD - Taking Lexapro and tolerating well.  Hyperlipidemia - tolerating atorvastatin 20 mg daily well.  Allergies  Allergen Reactions   Codeine Shortness Of Breath   Sulfa Antibiotics Nausea Only   Other Other (See Comments) and Rash    Outpatient Encounter Medications as of 10/02/2020  Medication Sig Note   aspirin EC 81 MG tablet Take 81 mg by mouth daily.    fexofenadine (ALLEGRA) 180 MG tablet Take 1 tablet (180 mg total) by mouth daily. 10/02/2020: PRN   [DISCONTINUED] ALPRAZolam (XANAX) 0.5 MG tablet TAKE 1 TABLET BY MOUTH TWICE A DAY AS NEEDED FOR ANXIETY    [DISCONTINUED] atorvastatin (LIPITOR) 20 MG tablet Take 1 tablet (20 mg total) by mouth daily.    [DISCONTINUED] escitalopram (LEXAPRO) 20 MG tablet Take 1 tablet (20 mg total) by mouth daily.    [START ON 10/18/2020] ALPRAZolam (XANAX) 0.5 MG tablet Take 1 tablet (0.5 mg total) by mouth at bedtime as needed for sleep.    atorvastatin (LIPITOR) 20 MG tablet Take 1 tablet (20 mg total) by mouth daily.    escitalopram (LEXAPRO) 20 MG tablet Take 1 tablet (20 mg total) by mouth daily.    No  facility-administered encounter medications on file as of 10/02/2020.    Patient Active Problem List   Diagnosis Date Noted   Controlled substance agreement signed 10/02/2020   Osteopenia 12/21/2018   Hearing loss 12/21/2018   Tobacco use 03/30/2017   OA (osteoarthritis) of hip 03/20/2016   Conversion disorder 03/20/2016   Tinnitus 04/26/2015   Chronic insomnia 08/01/2014   Mild hyperlipidemia 08/01/2014   Anxiety and depression 04/21/2014    Past Medical History:  Diagnosis Date   Anxiety    Cancer ([place]) 11/11/2009   Deaf    Hemoptysis 03/15/2020   Hyperlipidemia    Stroke ([place]) 04/19/2014    Relevant past medical, surgical, family and social history reviewed and updated as indicated. Interim medical history since our last visit reviewed.  Review of Systems Per HPI unless specifically indicated above     Objective:    There were no vitals taken for this visit.  Wt Readings from Last 3 Encounters:  07/01/20 134 lb 9.6 oz (61.1 kg)  02/07/20 136 lb (61.7 kg)  12/21/18 135 lb (61.2 kg)    Physical Exam Physical examination unable to performed today due to lack of equipment.     Assessment & Plan:   Problem List Items Addressed This Visit       Other   Tobacco use    Chronic.  Patient is  in precontemplative phase and is not interested in cessation today.  I did recommend cessation however.  Discussed low-dose lung cancer screening- had CT chest with contrast earlier this year and low-dose cancer screening will be due in February 2023.  We will revisit with patient at next visit.        Mild hyperlipidemia    Chronic.  Well-controlled with atorvastatin 20 mg daily.  We will plan to continue this medication-refills given.  Recent labs good.  Follow-up in 3 months for lab recheck.       Relevant Medications   atorvastatin (LIPITOR) 20 MG tablet   Other Relevant Orders   Lipid panel   COMPLETE METABOLIC PANEL WITH GFR   Controlled substance agreement signed    Chronic insomnia    Chronic, stable.  PDMP reviewed and appropriate. Pt is aware of risks of psychoactive medication use to include increased sedation, respiratory suppression, falls, extrapyramidal movements,  dependence and cardiovascular events.  Pt would like to continue treatment as benefit determined to outweigh risk.  Will refill alprazolam 0.5 mg nightly as needed for sleep.  Will need UDS at next visit.  Controlled substance agreement has been signed in the past.        Relevant Medications   ALPRAZolam (XANAX) 0.5 MG tablet (Start on 10/18/2020)   Other Relevant Orders   VITAMIN D 25 Hydroxy (Vit-D Deficiency, Fractures)   TSH   CBC with Differential/Platelet   Anxiety and depression - Primary    Chronic, stable.  We will plan to refill Lexapro 20 mg daily and continue on this medication for now.  Labs are up-to-date.       Relevant Medications   escitalopram (LEXAPRO) 20 MG tablet   ALPRAZolam (XANAX) 0.5 MG tablet (Start on 10/18/2020)   Other Relevant Orders   TSH   CBC with Differential/Platelet     Follow up plan: Return in about 3 months (around 01/02/2021) for mood, osteoporosis, hld with labs.   This visit was completed via telephone due to the restrictions of the COVID-19 pandemic. All issues as above were discussed and addressed but no physical exam was performed. If it was felt that the patient should be evaluated in the office, they were directed there. The patient verbally consented to this visit. Patient was unable to complete an audio/visual visit due to Lack of equipment. Location of the patient: home Location of the provider: work Those involved with this call:  Provider: Noemi Chapel, DNP, FNP-C CMA: Annabelle Harman, CMA Front Desk/Registration: Vevelyn Pat  Time spent on call:  14 minutes on the phone discussing health concerns. 15 minutes total spent in review of patient's record and preparation of their chart. I verified patient identity  using two factors (patient name and date of birth). Patient consents verbally to being seen via telemedicine visit today.

## 2020-10-02 NOTE — Assessment & Plan Note (Signed)
Chronic.  Patient is in precontemplative phase and is not interested in cessation today.  I did recommend cessation however.  Discussed low-dose lung cancer screening- had CT chest with contrast earlier this year and low-dose cancer screening will be due in February 2023.  We will revisit with patient at next visit.

## 2020-10-02 NOTE — Assessment & Plan Note (Signed)
Chronic, stable.  We will plan to refill Lexapro 20 mg daily and continue on this medication for now.  Labs are up-to-date.

## 2020-10-02 NOTE — Assessment & Plan Note (Signed)
Chronic.  Well-controlled with atorvastatin 20 mg daily.  We will plan to continue this medication-refills given.  Recent labs good.  Follow-up in 3 months for lab recheck.

## 2020-12-24 ENCOUNTER — Other Ambulatory Visit: Payer: Medicare HMO

## 2020-12-30 ENCOUNTER — Other Ambulatory Visit: Payer: Medicare HMO

## 2020-12-30 ENCOUNTER — Other Ambulatory Visit: Payer: Self-pay

## 2020-12-30 DIAGNOSIS — F5104 Psychophysiologic insomnia: Secondary | ICD-10-CM

## 2020-12-30 DIAGNOSIS — E785 Hyperlipidemia, unspecified: Secondary | ICD-10-CM | POA: Diagnosis not present

## 2020-12-30 DIAGNOSIS — R69 Illness, unspecified: Secondary | ICD-10-CM | POA: Diagnosis not present

## 2020-12-30 DIAGNOSIS — E559 Vitamin D deficiency, unspecified: Secondary | ICD-10-CM | POA: Diagnosis not present

## 2020-12-30 DIAGNOSIS — F32A Depression, unspecified: Secondary | ICD-10-CM

## 2020-12-30 DIAGNOSIS — F419 Anxiety disorder, unspecified: Secondary | ICD-10-CM | POA: Diagnosis not present

## 2020-12-31 LAB — LIPID PANEL
Cholesterol: 146 mg/dL (ref <200)
HDL: 55 mg/dL (ref >=50)
LDL Cholesterol (Calc): 75 mg/dL (calc)
Non-HDL Cholesterol (Calc): 91 mg/dL (calc) (ref <130)
Total CHOL/HDL Ratio: 2.7 (calc) (ref <5.0)
Triglycerides: 76 mg/dL (ref <150)

## 2020-12-31 LAB — COMPLETE METABOLIC PANEL WITH GFR
AG Ratio: 1.8 (calc) (ref 1.0–2.5)
ALT: 14 U/L (ref 6–29)
AST: 13 U/L (ref 10–35)
Albumin: 4.2 g/dL (ref 3.6–5.1)
Alkaline phosphatase (APISO): 57 U/L (ref 37–153)
BUN/Creatinine Ratio: 14 (calc) (ref 6–22)
BUN: 15 mg/dL (ref 7–25)
CO2: 30 mmol/L (ref 20–32)
Calcium: 9.3 mg/dL (ref 8.6–10.4)
Chloride: 108 mmol/L (ref 98–110)
Creat: 1.05 mg/dL — ABNORMAL HIGH (ref 0.60–1.00)
Globulin: 2.3 g/dL (calc) (ref 1.9–3.7)
Glucose, Bld: 99 mg/dL (ref 65–99)
Potassium: 4.2 mmol/L (ref 3.5–5.3)
Sodium: 144 mmol/L (ref 135–146)
Total Bilirubin: 0.4 mg/dL (ref 0.2–1.2)
Total Protein: 6.5 g/dL (ref 6.1–8.1)
eGFR: 57 mL/min/{1.73_m2} — ABNORMAL LOW (ref >=60)

## 2020-12-31 LAB — CBC WITH DIFFERENTIAL/PLATELET
Absolute Monocytes: 490 cells/uL (ref 200–950)
Basophils Absolute: 68 cells/uL (ref 0–200)
Basophils Relative: 1 %
Eosinophils Absolute: 170 cells/uL (ref 15–500)
Eosinophils Relative: 2.5 %
HCT: 42.8 % (ref 35.0–45.0)
Hemoglobin: 14.1 g/dL (ref 11.7–15.5)
Lymphs Abs: 2054 cells/uL (ref 850–3900)
MCH: 30.5 pg (ref 27.0–33.0)
MCHC: 32.9 g/dL (ref 32.0–36.0)
MCV: 92.6 fL (ref 80.0–100.0)
MPV: 10.4 fL (ref 7.5–12.5)
Monocytes Relative: 7.2 %
Neutro Abs: 4019 cells/uL (ref 1500–7800)
Neutrophils Relative %: 59.1 %
Platelets: 264 10*3/uL (ref 140–400)
RBC: 4.62 10*6/uL (ref 3.80–5.10)
RDW: 12.3 % (ref 11.0–15.0)
Total Lymphocyte: 30.2 %
WBC: 6.8 10*3/uL (ref 3.8–10.8)

## 2020-12-31 LAB — TSH: TSH: 3.5 mIU/L (ref 0.40–4.50)

## 2020-12-31 LAB — VITAMIN D 25 HYDROXY (VIT D DEFICIENCY, FRACTURES): Vit D, 25-Hydroxy: 22 ng/mL — ABNORMAL LOW (ref 30–100)

## 2021-01-02 ENCOUNTER — Encounter: Payer: Self-pay | Admitting: Nurse Practitioner

## 2021-01-02 ENCOUNTER — Ambulatory Visit (INDEPENDENT_AMBULATORY_CARE_PROVIDER_SITE_OTHER): Payer: Medicare HMO | Admitting: Nurse Practitioner

## 2021-01-02 ENCOUNTER — Other Ambulatory Visit: Payer: Self-pay

## 2021-01-02 VITALS — BP 120/78 | HR 59 | Temp 97.6°F | Wt 134.6 lb

## 2021-01-02 DIAGNOSIS — F32A Depression, unspecified: Secondary | ICD-10-CM

## 2021-01-02 DIAGNOSIS — E785 Hyperlipidemia, unspecified: Secondary | ICD-10-CM

## 2021-01-02 DIAGNOSIS — F5104 Psychophysiologic insomnia: Secondary | ICD-10-CM

## 2021-01-02 DIAGNOSIS — E559 Vitamin D deficiency, unspecified: Secondary | ICD-10-CM

## 2021-01-02 DIAGNOSIS — R69 Illness, unspecified: Secondary | ICD-10-CM | POA: Diagnosis not present

## 2021-01-02 DIAGNOSIS — F419 Anxiety disorder, unspecified: Secondary | ICD-10-CM | POA: Diagnosis not present

## 2021-01-02 MED ORDER — ALPRAZOLAM 0.5 MG PO TABS: 0.5000 mg | ORAL_TABLET | Freq: Every evening | ORAL | 2 refills | Status: DC | PRN

## 2021-01-02 NOTE — Assessment & Plan Note (Addendum)
Chronic.  Not currently taking oral supplementation.  Vitamin D level 22.  Start Vitamin D2 or D3 OTC 1000-2000 IU daily and will plan to recheck in 6 months.  Consider DEXA repeat scan at next appointment.

## 2021-01-02 NOTE — Progress Notes (Signed)
Subjective:    Patient ID: Sonia Oliver, female    DOB: Dec 05, 1950, 70 y.o.   MRN: [id number]  HPI: Sonia Oliver is a 70 y.o. female presenting for follow up.  Chief Complaint  Patient presents with   Follow-up    3 month follow up    ANXIETY/DEPRESSION Patient is currently taking escitalopram 20 mg daily.  She reports this medication works well for her.  It has recently become much more expensive and she would like to try a new pharmacy.  INSOMNIA Patient reports she takes alprazolam 0.5 mg daily for sleep.  She acknowledges that her body is dependent on this medication, however reports she has missed a couple of days of it in the past and did not have typical withdrawal symptoms.   Duration: chronic Satisfied with sleep quality: yes Difficulty falling asleep: yes Difficulty staying asleep: no Waking a few hours after sleep onset: no Early morning awakenings: no Daytime hypersomnolence: no Wakes feeling refreshed: yes Good sleep hygiene: yes Depressed/anxious mood: no Recent stress: no  HYPERLIPIDEMIA Currently taking atorvastatin 20 mg daily.  Tolerating this well. Hyperlipidemia status: stable Satisfied with current treatment?  yes Side effects:  no Medication compliance: excellent compliance Aspirin:  yes The 10-year ASCVD risk score (Arnett DK, et al., 2019) is: 12.3%   Values used to calculate the score:     Age: 42 years     Sex: Female     Is Non-Hispanic African American: No     Diabetic: No     Tobacco smoker: Yes     Systolic Blood Pressure: 120 mmHg     Is BP treated: No     HDL Cholesterol: 55 mg/dL     Total Cholesterol: 146 mg/dL Chest pain:  no Myalgias: no LDL goal: less than 100  VITAMIN D DEFICIENCY Reports fell 2 months ago, did not get hurt but did have bruises.  Lives with husband.  No longer taking Vitamin D over the counter. Duration: years Previous Vitamin D level: 22 Current supplementation: none Complications of low vitamin d:  none known  Allergies  Allergen Reactions   Codeine Shortness Of Breath   Sulfa Antibiotics Nausea Only   Other Other (See Comments) and Rash    Outpatient Encounter Medications as of 01/02/2021  Medication Sig Note   aspirin EC 81 MG tablet Take 81 mg by mouth daily.    atorvastatin (LIPITOR) 20 MG tablet Take 1 tablet (20 mg total) by mouth daily.    escitalopram (LEXAPRO) 20 MG tablet Take 1 tablet (20 mg total) by mouth daily.    fexofenadine (ALLEGRA) 180 MG tablet Take 1 tablet (180 mg total) by mouth daily. 10/02/2020: PRN   [DISCONTINUED] ALPRAZolam (XANAX) 0.5 MG tablet Take 1 tablet (0.5 mg total) by mouth at bedtime as needed for sleep.    [START ON 02/04/2021] ALPRAZolam (XANAX) 0.5 MG tablet Take 1 tablet (0.5 mg total) by mouth at bedtime as needed for sleep.    No facility-administered encounter medications on file as of 01/02/2021.    Patient Active Problem List   Diagnosis Date Noted   Vitamin D deficiency 01/02/2021   Controlled substance agreement signed 10/02/2020   Osteopenia 12/21/2018   Hearing loss 12/21/2018   Tobacco use 03/30/2017   OA (osteoarthritis) of hip 03/20/2016   Conversion disorder 03/20/2016   Tinnitus 04/26/2015   Chronic insomnia 08/01/2014   Mild hyperlipidemia 08/01/2014   Anxiety and depression 04/21/2014    Past Medical  History:  Diagnosis Date   Anxiety    Cancer ([place]) 11/11/2009   Deaf    Hemoptysis 03/15/2020   Hyperlipidemia    Stroke ([place]) 04/19/2014    Relevant past medical, surgical, family and social history reviewed and updated as indicated. Interim medical history since our last visit reviewed.  Review of Systems Per HPI unless specifically indicated above     Objective:    BP 120/78   Pulse (!) 59   Temp 97.6 F (36.4 C)   Wt 134 lb 9.6 oz (61.1 kg)   SpO2 93%   BMI 21.73 kg/m   Wt Readings from Last 3 Encounters:  01/02/21 134 lb 9.6 oz (61.1 kg)  07/01/20 134 lb 9.6 oz (61.1 kg)  02/07/20 136 lb  (61.7 kg)    Physical Exam Vitals and nursing note reviewed.  Constitutional:      General: She is not in acute distress.    Appearance: Normal appearance. She is not toxic-appearing.  HENT:     Head: Normocephalic and atraumatic.  Eyes:     General: No scleral icterus.    Extraocular Movements: Extraocular movements intact.  Cardiovascular:     Rate and Rhythm: Normal rate and regular rhythm.     Heart sounds: Normal heart sounds. No murmur heard. Pulmonary:     Effort: Pulmonary effort is normal. No respiratory distress.     Breath sounds: Normal breath sounds. No wheezing or rales.  Musculoskeletal:        General: Normal range of motion.     Cervical back: Normal range of motion and neck supple.     Right lower leg: No edema.     Left lower leg: No edema.  Lymphadenopathy:     Cervical: No cervical adenopathy.  Skin:    General: Skin is warm and dry.     Capillary Refill: Capillary refill takes less than 2 seconds.     Coloration: Skin is not jaundiced or pale.     Findings: No erythema.  Neurological:     Mental Status: She is alert and oriented to person, place, and time.     Gait: Gait normal.  Psychiatric:        Mood and Affect: Mood normal.        Behavior: Behavior normal.        Thought Content: Thought content normal.        Judgment: Judgment normal.    Results for orders placed or performed in visit on 12/30/20  CBC with Differential/Platelet  Result Value Ref Range   WBC 6.8 3.8 - 10.8 Thousand/uL   RBC 4.62 3.80 - 5.10 Million/uL   Hemoglobin 14.1 11.7 - 15.5 g/dL   HCT 42.8 35.0 - 45.0 %   MCV 92.6 80.0 - 100.0 fL   MCH 30.5 27.0 - 33.0 pg   MCHC 32.9 32.0 - 36.0 g/dL   RDW 12.3 11.0 - 15.0 %   Platelets 264 140 - 400 Thousand/uL   MPV 10.4 7.5 - 12.5 fL   Neutro Abs 4,019 1,500 - 7,800 cells/uL   Lymphs Abs 2,054 850 - 3,900 cells/uL   Absolute Monocytes 490 200 - 950 cells/uL   Eosinophils Absolute 170 15 - 500 cells/uL   Basophils Absolute  68 0 - 200 cells/uL   Neutrophils Relative % 59.1 %   Total Lymphocyte 30.2 %   Monocytes Relative 7.2 %   Eosinophils Relative 2.5 %   Basophils Relative 1.0 %  TSH  Result  Value Ref Range   TSH 3.50 0.40 - 4.50 mIU/L  VITAMIN D 25 Hydroxy (Vit-D Deficiency, Fractures)  Result Value Ref Range   Vit D, 25-Hydroxy 22 (L) 30 - 100 ng/mL  COMPLETE METABOLIC PANEL WITH GFR  Result Value Ref Range   Glucose, Bld 99 65 - 99 mg/dL   BUN 15 7 - 25 mg/dL   Creat 1.05 (H) 0.60 - 1.00 mg/dL   eGFR 57 (L) > OR = 60 mL/min/1.8m2   BUN/Creatinine Ratio 14 6 - 22 (calc)   Sodium 144 135 - 146 mmol/L   Potassium 4.2 3.5 - 5.3 mmol/L   Chloride 108 98 - 110 mmol/L   CO2 30 20 - 32 mmol/L   Calcium 9.3 8.6 - 10.4 mg/dL   Total Protein 6.5 6.1 - 8.1 g/dL   Albumin 4.2 3.6 - 5.1 g/dL   Globulin 2.3 1.9 - 3.7 g/dL (calc)   AG Ratio 1.8 1.0 - 2.5 (calc)   Total Bilirubin 0.4 0.2 - 1.2 mg/dL   Alkaline phosphatase (APISO) 57 37 - 153 U/L   AST 13 10 - 35 U/L   ALT 14 6 - 29 U/L  Lipid panel  Result Value Ref Range   Cholesterol 146 <200 mg/dL   HDL 55 > OR = 50 mg/dL   Triglycerides 76 <150 mg/dL   LDL Cholesterol (Calc) 75 mg/dL (calc)   Total CHOL/HDL Ratio 2.7 <5.0 (calc)   Non-HDL Cholesterol (Calc) 91 <130 mg/dL (calc)      Assessment & Plan:   Problem List Items Addressed This Visit       Other   Vitamin D deficiency    Chronic.  Not currently taking oral supplementation.  Vitamin D level 22.  Start Vitamin D2 or D3 OTC 1000-2000 IU daily and will plan to recheck in 6 months.  Consider DEXA repeat scan at next appointment.      Mild hyperlipidemia    Chronic.  Recent lipid panel and liver enzymes normal.  LDL less than 100.  Continue atorvastatin 20 mg daily.  Follow up in 6 months.       Chronic insomnia    Chronic.  Stable with alprazolam 0.5 mg nightly as needed.  PDMP reviewed and appropriate.  Pt is aware of risks of psychoactive medication use to include increased  sedation, respiratory suppression, falls, extrapyramidal movements,  dependence and cardiovascular events.  Pt would like to continue treatment as benefit determined to outweigh risk.  Refill of alprazolam 0.5 mg sent to patient's preferred pharmacy.  UDS next visit.  Controlled substance agreement has been signed.  Follow up in 3 months.       Relevant Medications   ALPRAZolam (XANAX) 0.5 MG tablet (Start on 02/04/2021)   Anxiety and depression - Primary    Chronic, stable.  Continue Lexapro 20 mg daily.  Recent CBC normal.  Discussed multiple different pharmacy options with patient including mail delivery and consulting with our CCM team.  Patient will let us know what she decides to help with cost.  Follow up 6 months.       Relevant Medications   ALPRAZolam (XANAX) 0.5 MG tablet (Start on 02/04/2021)     Follow up plan: Return in about 3 months (around 04/04/2021) for anxiety follow up.

## 2021-01-02 NOTE — Assessment & Plan Note (Signed)
Chronic, stable.  Continue Lexapro 20 mg daily.  Recent CBC normal.  Discussed multiple different pharmacy options with patient including mail delivery and consulting with our CCM team.  Patient will let us know what she decides to help with cost.  Follow up 6 months.

## 2021-01-02 NOTE — Patient Instructions (Signed)
Start back on Vitamin D2 or D3 1000-2000 IU daily to help with your bones.

## 2021-01-02 NOTE — Assessment & Plan Note (Signed)
Chronic.  Stable with alprazolam 0.5 mg nightly as needed.  PDMP reviewed and appropriate.  Pt is aware of risks of psychoactive medication use to include increased sedation, respiratory suppression, falls, extrapyramidal movements,  dependence and cardiovascular events.  Pt would like to continue treatment as benefit determined to outweigh risk.  Refill of alprazolam 0.5 mg sent to patient's preferred pharmacy.  UDS next visit.  Controlled substance agreement has been signed.  Follow up in 3 months.

## 2021-01-02 NOTE — Assessment & Plan Note (Signed)
Chronic.  Recent lipid panel and liver enzymes normal.  LDL less than 100.  Continue atorvastatin 20 mg daily.  Follow up in 6 months.

## 2021-01-28 ENCOUNTER — Other Ambulatory Visit: Payer: Self-pay | Admitting: Family Medicine

## 2021-01-28 ENCOUNTER — Other Ambulatory Visit: Payer: Self-pay | Admitting: Nurse Practitioner

## 2021-01-28 DIAGNOSIS — Z1231 Encounter for screening mammogram for malignant neoplasm of breast: Secondary | ICD-10-CM

## 2021-02-03 ENCOUNTER — Other Ambulatory Visit: Payer: Self-pay | Admitting: Nurse Practitioner

## 2021-02-03 DIAGNOSIS — F5104 Psychophysiologic insomnia: Secondary | ICD-10-CM

## 2021-02-03 NOTE — Telephone Encounter (Signed)
PDMP reviewed an appropriate.  Patient takes this medication chronically - see last note from November.  Plan to follow up every 3 months to reassess.

## 2021-03-04 ENCOUNTER — Ambulatory Visit
Admission: RE | Admit: 2021-03-04 | Discharge: 2021-03-04 | Disposition: A | Discharge status: Home / Self Care | Payer: Medicare HMO | Source: Ambulatory Visit | Attending: Nurse Practitioner | Admitting: Nurse Practitioner

## 2021-03-04 DIAGNOSIS — Z1231 Encounter for screening mammogram for malignant neoplasm of breast: Secondary | ICD-10-CM | POA: Diagnosis not present

## 2021-03-17 ENCOUNTER — Encounter: Payer: Self-pay | Admitting: Nurse Practitioner

## 2021-03-17 ENCOUNTER — Telehealth: Payer: Self-pay | Admitting: Nurse Practitioner

## 2021-03-17 ENCOUNTER — Other Ambulatory Visit: Payer: Self-pay

## 2021-03-17 ENCOUNTER — Telehealth (INDEPENDENT_AMBULATORY_CARE_PROVIDER_SITE_OTHER): Payer: Medicare HMO | Admitting: Nurse Practitioner

## 2021-03-17 DIAGNOSIS — F32A Depression, unspecified: Secondary | ICD-10-CM

## 2021-03-17 DIAGNOSIS — F419 Anxiety disorder, unspecified: Secondary | ICD-10-CM

## 2021-03-17 DIAGNOSIS — E785 Hyperlipidemia, unspecified: Secondary | ICD-10-CM | POA: Diagnosis not present

## 2021-03-17 DIAGNOSIS — R69 Illness, unspecified: Secondary | ICD-10-CM | POA: Diagnosis not present

## 2021-03-17 DIAGNOSIS — U071 COVID-19: Secondary | ICD-10-CM | POA: Diagnosis not present

## 2021-03-17 MED ORDER — ATORVASTATIN CALCIUM 20 MG PO TABS: 20.0000 mg | ORAL_TABLET | Freq: Every day | ORAL | 1 refills | Status: AC

## 2021-03-17 MED ORDER — MOLNUPIRAVIR EUA 200MG CAPSULE: 4.0000 | ORAL_CAPSULE | Freq: Two times a day (BID) | ORAL | 0 refills | Status: AC

## 2021-03-17 MED ORDER — ESCITALOPRAM OXALATE 20 MG PO TABS: 20.0000 mg | ORAL_TABLET | Freq: Every day | ORAL | 1 refills | Status: AC

## 2021-03-17 NOTE — Telephone Encounter (Signed)
Patient requesting for all prescriptions to be sent to [facility] on [address] near corner of [address] [place].  Please advise at [phone].

## 2021-03-17 NOTE — Progress Notes (Signed)
Subjective:    Patient ID: Sonia Oliver, female    DOB: November 28, 1950, 71 y.o.   MRN: [id number]  HPI: Sonia Oliver is a 71 y.o. female presenting for  Chief Complaint  Patient presents with   Covid Positive   COVID-19 Onset: 03/14/2021 COVID-19 testing history: tested positive 03/14/2021 Fever: yes; mostly in afternoon - low grade Body aches: no Chills: yes Cough: no; coughing up phlegm Shortness of breath: no Wheezing: no Chest pain: no Chest tightness: no Chest congestion: no Nasal congestion: no Runny nose: yes Post nasal drip: no Sneezing:  yes Sore throat: no Swollen glands: no Sinus pressure: yes Headache: yes Face pain: no Toothache: yes Ear pain: no  Ear pressure: no  Eyes red/itching:yes Eye drainage/crusting: yes  Nausea: no  Vomiting: no Diarrhea: yes  Change in appetite:  yes; decreased   Loss of taste/smell: no  Rash: no Fatigue: yes Sick contacts: yes; husband is sick with COVID Strep contacts: no  Context: better Recurrent sinusitis: no Treatments attempted: Tylenol Relief with OTC medications: yes  She is also requesting refills of atorvastatin 20 mg daily and escitalopram 20 mg daily for a couple months until she can establish with a new PCP.  Allergies  Allergen Reactions   Codeine Shortness Of Breath   Sulfa Antibiotics Nausea Only   Other Other (See Comments) and Rash    Outpatient Encounter Medications as of 03/17/2021  Medication Sig Note   ALPRAZolam (XANAX) 0.5 MG tablet TAKE ONE TABLET BY MOUTH AT BEDTIME AS NEEDED FOR SLEEP    aspirin EC 81 MG tablet Take 81 mg by mouth daily.    fexofenadine (ALLEGRA) 180 MG tablet Take 1 tablet (180 mg total) by mouth daily. 10/02/2020: PRN   molnupiravir EUA (LAGEVRIO) 200 mg CAPS capsule Take 4 capsules (800 mg total) by mouth 2 (two) times daily for 5 days.    [DISCONTINUED] atorvastatin (LIPITOR) 20 MG tablet Take 1 tablet (20 mg total) by mouth daily.    [DISCONTINUED] escitalopram  (LEXAPRO) 20 MG tablet Take 1 tablet (20 mg total) by mouth daily.    atorvastatin (LIPITOR) 20 MG tablet Take 1 tablet (20 mg total) by mouth daily.    escitalopram (LEXAPRO) 20 MG tablet Take 1 tablet (20 mg total) by mouth daily.    No facility-administered encounter medications on file as of 03/17/2021.    Patient Active Problem List   Diagnosis Date Noted   Vitamin D deficiency 01/02/2021   Controlled substance agreement signed 10/02/2020   Osteopenia 12/21/2018   Hearing loss 12/21/2018   Tobacco use 03/30/2017   OA (osteoarthritis) of hip 03/20/2016   Conversion disorder 03/20/2016   Tinnitus 04/26/2015   Chronic insomnia 08/01/2014   Mild hyperlipidemia 08/01/2014   Anxiety and depression 04/21/2014    Past Medical History:  Diagnosis Date   Anxiety    Cancer ([place]) 11/11/2009   Deaf    Hemoptysis 03/15/2020   Hyperlipidemia    Stroke ([place]) 04/19/2014    Relevant past medical, surgical, family and social history reviewed and updated as indicated. Interim medical history since our last visit reviewed.  Review of Systems Per HPI unless specifically indicated above     Objective:    There were no vitals taken for this visit.  Wt Readings from Last 3 Encounters:  01/02/21 134 lb 9.6 oz (61.1 kg)  07/01/20 134 lb 9.6 oz (61.1 kg)  02/07/20 136 lb (61.7 kg)    Physical Exam Nursing note reviewed.  Constitutional:      General: She is not in acute distress.    Appearance: Normal appearance. She is not ill-appearing or toxic-appearing.  HENT:     Head: Normocephalic and atraumatic.     Nose: Nose normal. No congestion or rhinorrhea.     Mouth/Throat:     Mouth: Mucous membranes are moist.     Pharynx: Oropharynx is clear.  Eyes:     General: No scleral icterus.    Extraocular Movements: Extraocular movements intact.  Cardiovascular:     Comments: Unable to assess heart sounds via virtual visit. Pulmonary:     Effort: Pulmonary effort is normal. No  respiratory distress.     Comments: Unable to assess breath sounds via virtual visit.  Patient talking in complete sentences during telemedicine visit without accessory muscle use. Musculoskeletal:        General: Normal range of motion.  Skin:    Coloration: Skin is not jaundiced or pale.     Findings: No erythema.  Neurological:     Mental Status: She is alert and oriented to person, place, and time.  Psychiatric:        Mood and Affect: Mood normal.        Behavior: Behavior normal.        Thought Content: Thought content normal.        Judgment: Judgment normal.      Assessment & Plan:  1. Mild hyperlipidemia Chronic.  Most recent LDL level of November 2022 was 75.  Continue atorvastatin 20 mg daily. refill given today.  - atorvastatin (LIPITOR) 20 MG tablet; Take 1 tablet (20 mg total) by mouth daily.  Dispense: 90 tablet; Refill: 1  2. COVID-19 Patient is high risk given smoking history, age, medical history.  Start molnupiravir twice daily for 5 days.  Also start nasal saline, steam showers, guaifenesin as needed for congestion.  Continue Tylenol as needed for fever.  Follow-up if symptoms not improving after 7 to 10 days. With any sudden onset new chest pain, dizziness, sweating, or shortness of breath, go to ED.  - molnupiravir EUA (LAGEVRIO) 200 mg CAPS capsule; Take 4 capsules (800 mg total) by mouth 2 (two) times daily for 5 days.  Dispense: 40 capsule; Refill: 0  3. Anxiety and depression Chronic.  Reports symptoms are well controlled with Lexapro 20 mg daily.  We will plan to continue for now-refill given.  - escitalopram (LEXAPRO) 20 MG tablet; Take 1 tablet (20 mg total) by mouth daily.  Dispense: 90 tablet; Refill: 1   Follow up plan: Return if symptoms worsen or fail to improve.   Due to the catastrophic nature of the COVID-19 pandemic, this video visit was completed soley via audio and visual contact via [facility] due to the restrictions of the COVID-19  pandemic.  All issues as above were discussed and addressed. Physical exam was done as above through visual confirmation on Caregility. If it was felt that the patient should be evaluated in the office, they were directed there. The patient verbally consented to this visit. Location of the patient: home Location of the provider: work Those involved with this call:  Provider: Noemi Chapel, DNP, FNP-C CMA: Elizabeth Palau, CMA Front Desk/Registration: Vevelyn Pat  Time spent on call:  13 minutes with patient face to face via video conference. More than 50% of this time was spent in counseling and coordination of care. 25 minutes total spent in review of patient's record and preparation of their chart. I  verified patient identity using two factors (patient name and date of birth). Patient consents verbally to being seen via telemedicine visit today.

## 2021-03-17 NOTE — Telephone Encounter (Signed)
Pharmacy updated.

## 2021-03-26 ENCOUNTER — Encounter: Payer: Self-pay | Admitting: Nurse Practitioner

## 2021-04-01 ENCOUNTER — Ambulatory Visit (INDEPENDENT_AMBULATORY_CARE_PROVIDER_SITE_OTHER): Payer: Medicare HMO | Admitting: Nurse Practitioner

## 2021-04-01 ENCOUNTER — Encounter: Payer: Self-pay | Admitting: Nurse Practitioner

## 2021-04-01 ENCOUNTER — Other Ambulatory Visit: Payer: Self-pay

## 2021-04-01 VITALS — BP 140/70 | HR 58 | Ht 66.0 in | Wt 133.0 lb

## 2021-04-01 DIAGNOSIS — F419 Anxiety disorder, unspecified: Secondary | ICD-10-CM

## 2021-04-01 DIAGNOSIS — F32A Depression, unspecified: Secondary | ICD-10-CM | POA: Diagnosis not present

## 2021-04-01 DIAGNOSIS — F5104 Psychophysiologic insomnia: Secondary | ICD-10-CM | POA: Diagnosis not present

## 2021-04-01 DIAGNOSIS — R69 Illness, unspecified: Secondary | ICD-10-CM | POA: Diagnosis not present

## 2021-04-01 MED ORDER — ALPRAZOLAM 0.5 MG PO TABS: 0.5000 mg | ORAL_TABLET | Freq: Every evening | ORAL | 0 refills | Status: DC | PRN

## 2021-04-01 NOTE — Assessment & Plan Note (Signed)
Chronic.  Stable with Lexapro 20 mg daily.  Refills have been sent in previously.

## 2021-04-01 NOTE — Assessment & Plan Note (Signed)
Chronic.  Stable with alprazolam 0.5 mg nightly as needed.  PDMP reviewed and appropriate and refill sent in.  Pt is aware of risks of psychoactive medication use to include increased sedation, respiratory suppression, falls, extrapyramidal movements,  dependence and cardiovascular events.  Pt would like to continue treatment as benefit determined to outweigh risk.

## 2021-04-01 NOTE — Progress Notes (Signed)
Subjective:    Patient ID: Sonia Oliver, female    DOB: 13-Jul-1950, 71 y.o.   MRN: [id number]  HPI: Sonia Oliver is a 71 y.o. female presenting for mood follow up.  DEPRESSION/ANXIETY Patient is currently taking escitalopram 20 mg daily.  She is taking alprazolam 0.5 mg daily as needed as well - takes nightly or her sleep significantly suffers.  She reports good control of her mood with Lexapro 20 mg daily and denies struggling with motivation, lack of pleasure in things, suicidal thoughts, and anxiety.    Allergies  Allergen Reactions   Codeine Shortness Of Breath   Sulfa Antibiotics Nausea Only   Other Other (See Comments) and Rash    Outpatient Encounter Medications as of 04/01/2021  Medication Sig Note   aspirin EC 81 MG tablet Take 81 mg by mouth daily.    atorvastatin (LIPITOR) 20 MG tablet Take 1 tablet (20 mg total) by mouth daily.    escitalopram (LEXAPRO) 20 MG tablet Take 1 tablet (20 mg total) by mouth daily.    fexofenadine (ALLEGRA) 180 MG tablet Take 1 tablet (180 mg total) by mouth daily. 10/02/2020: PRN   [DISCONTINUED] ALPRAZolam (XANAX) 0.5 MG tablet TAKE ONE TABLET BY MOUTH AT BEDTIME AS NEEDED FOR SLEEP    [START ON 04/04/2021] ALPRAZolam (XANAX) 0.5 MG tablet Take 1 tablet (0.5 mg total) by mouth at bedtime as needed. for sleep    No facility-administered encounter medications on file as of 04/01/2021.    Patient Active Problem List   Diagnosis Date Noted   Vitamin D deficiency 01/02/2021   Controlled substance agreement signed 10/02/2020   Osteopenia 12/21/2018   Hearing loss 12/21/2018   Tobacco use 03/30/2017   OA (osteoarthritis) of hip 03/20/2016   Conversion disorder 03/20/2016   Tinnitus 04/26/2015   Chronic insomnia 08/01/2014   Mild hyperlipidemia 08/01/2014   Anxiety and depression 04/21/2014    Past Medical History:  Diagnosis Date   Anxiety    Cancer ([place]) 11/11/2009   Deaf    Hemoptysis 03/15/2020   Hyperlipidemia    Stroke ([place])  04/19/2014    Relevant past medical, surgical, family and social history reviewed and updated as indicated. Interim medical history since our last visit reviewed.  Review of Systems Per HPI unless specifically indicated above     Objective:    BP 140/70    Pulse (!) 58    Ht 5\' 6"  (1.676 m)    Wt 133 lb (60.3 kg)    SpO2 97%    BMI 21.47 kg/m   Wt Readings from Last 3 Encounters:  04/01/21 133 lb (60.3 kg)  01/02/21 134 lb 9.6 oz (61.1 kg)  07/01/20 134 lb 9.6 oz (61.1 kg)    Physical Exam Vitals and nursing note reviewed.  Constitutional:      General: She is not in acute distress.    Appearance: Normal appearance. She is not toxic-appearing.  HENT:     Head: Normocephalic and atraumatic.     Nose: Nose normal. No congestion.     Mouth/Throat:     Mouth: Mucous membranes are moist.     Pharynx: Oropharynx is clear.  Eyes:     General:        Right eye: No discharge.        Left eye: No discharge.     Extraocular Movements: Extraocular movements intact.  Cardiovascular:     Rate and Rhythm: Normal rate and regular rhythm.  Heart sounds: Normal heart sounds. No murmur heard. Pulmonary:     Effort: Pulmonary effort is normal. No respiratory distress.     Breath sounds: Normal breath sounds. No wheezing, rhonchi or rales.  Skin:    General: Skin is warm and dry.     Coloration: Skin is not jaundiced or pale.     Findings: No erythema.  Neurological:     Mental Status: She is alert and oriented to person, place, and time.     Motor: No weakness.     Gait: Gait normal.  Psychiatric:        Mood and Affect: Mood normal.        Behavior: Behavior normal.        Thought Content: Thought content normal.        Judgment: Judgment normal.      Assessment & Plan:   Problem List Items Addressed This Visit       Other   Chronic insomnia    Chronic.  Stable with alprazolam 0.5 mg nightly as needed.  PDMP reviewed and appropriate and refill sent in.  Pt is aware of  risks of psychoactive medication use to include increased sedation, respiratory suppression, falls, extrapyramidal movements,  dependence and cardiovascular events.  Pt would like to continue treatment as benefit determined to outweigh risk.       Relevant Medications   ALPRAZolam (XANAX) 0.5 MG tablet (Start on 04/04/2021)   Anxiety and depression - Primary    Chronic.  Stable with Lexapro 20 mg daily.  Refills have been sent in previously.        Relevant Medications   ALPRAZolam (XANAX) 0.5 MG tablet (Start on 04/04/2021)     Follow up plan: Return for with new PCP.

## 2021-04-04 ENCOUNTER — Ambulatory Visit: Payer: Medicare HMO | Admitting: Nurse Practitioner

## 2021-05-20 DIAGNOSIS — E782 Mixed hyperlipidemia: Secondary | ICD-10-CM | POA: Diagnosis not present

## 2021-05-20 DIAGNOSIS — I10 Essential (primary) hypertension: Secondary | ICD-10-CM | POA: Diagnosis not present

## 2021-05-20 DIAGNOSIS — Z716 Tobacco abuse counseling: Secondary | ICD-10-CM | POA: Diagnosis not present

## 2021-05-20 DIAGNOSIS — Z0189 Encounter for other specified special examinations: Secondary | ICD-10-CM | POA: Diagnosis not present

## 2021-05-20 DIAGNOSIS — R69 Illness, unspecified: Secondary | ICD-10-CM | POA: Diagnosis not present

## 2021-05-20 DIAGNOSIS — G4700 Insomnia, unspecified: Secondary | ICD-10-CM | POA: Diagnosis not present

## 2021-06-05 DIAGNOSIS — R7303 Prediabetes: Secondary | ICD-10-CM | POA: Diagnosis not present

## 2021-06-05 DIAGNOSIS — E782 Mixed hyperlipidemia: Secondary | ICD-10-CM | POA: Diagnosis not present

## 2021-06-05 DIAGNOSIS — E559 Vitamin D deficiency, unspecified: Secondary | ICD-10-CM | POA: Diagnosis not present

## 2021-06-24 DIAGNOSIS — R69 Illness, unspecified: Secondary | ICD-10-CM | POA: Diagnosis not present

## 2021-06-24 DIAGNOSIS — I10 Essential (primary) hypertension: Secondary | ICD-10-CM | POA: Diagnosis not present

## 2021-06-24 DIAGNOSIS — Z716 Tobacco abuse counseling: Secondary | ICD-10-CM | POA: Diagnosis not present

## 2021-06-24 DIAGNOSIS — E782 Mixed hyperlipidemia: Secondary | ICD-10-CM | POA: Diagnosis not present

## 2021-06-24 DIAGNOSIS — Z7689 Persons encountering health services in other specified circumstances: Secondary | ICD-10-CM | POA: Diagnosis not present

## 2021-06-24 DIAGNOSIS — N393 Stress incontinence (female) (male): Secondary | ICD-10-CM | POA: Diagnosis not present

## 2021-06-24 DIAGNOSIS — E559 Vitamin D deficiency, unspecified: Secondary | ICD-10-CM | POA: Diagnosis not present

## 2021-06-24 DIAGNOSIS — G4700 Insomnia, unspecified: Secondary | ICD-10-CM | POA: Diagnosis not present

## 2021-06-24 DIAGNOSIS — R7303 Prediabetes: Secondary | ICD-10-CM | POA: Diagnosis not present

## 2021-06-25 ENCOUNTER — Telehealth: Payer: Self-pay

## 2021-06-25 NOTE — Telephone Encounter (Signed)
PA LETTER CAME IN TODAY, HOWEVER, SHE IS NOT DR PICKARD'S PT/NOT SEEN HERE. ? ?NO REFILLS SHOULD BE TAKE FOR PT, NEED TO GET NEW PCP ?

## 2021-07-02 DIAGNOSIS — L570 Actinic keratosis: Secondary | ICD-10-CM | POA: Diagnosis not present

## 2021-07-02 DIAGNOSIS — R519 Headache, unspecified: Secondary | ICD-10-CM | POA: Diagnosis not present

## 2021-07-02 DIAGNOSIS — M545 Low back pain, unspecified: Secondary | ICD-10-CM | POA: Diagnosis not present

## 2021-07-02 DIAGNOSIS — M25521 Pain in right elbow: Secondary | ICD-10-CM | POA: Diagnosis not present

## 2021-07-02 DIAGNOSIS — L299 Pruritus, unspecified: Secondary | ICD-10-CM | POA: Diagnosis not present

## 2021-07-24 DIAGNOSIS — L570 Actinic keratosis: Secondary | ICD-10-CM | POA: Diagnosis not present

## 2021-07-24 DIAGNOSIS — B353 Tinea pedis: Secondary | ICD-10-CM | POA: Diagnosis not present

## 2021-07-24 DIAGNOSIS — Z1283 Encounter for screening for malignant neoplasm of skin: Secondary | ICD-10-CM | POA: Diagnosis not present

## 2021-07-24 DIAGNOSIS — D225 Melanocytic nevi of trunk: Secondary | ICD-10-CM | POA: Diagnosis not present

## 2021-07-24 DIAGNOSIS — X32XXXA Exposure to sunlight, initial encounter: Secondary | ICD-10-CM | POA: Diagnosis not present

## 2021-07-30 ENCOUNTER — Other Ambulatory Visit: Payer: Self-pay | Admitting: Nurse Practitioner

## 2021-07-30 DIAGNOSIS — F5104 Psychophysiologic insomnia: Secondary | ICD-10-CM

## 2021-07-30 NOTE — Telephone Encounter (Signed)
Requested medication (s) are due for refill today:Yes  Requested medication (s) are on the active medication list: yes    Last refill: 04/01/21  #30  0 refills  Future visit scheduled no  Notes to clinic:Cannot refuse non-delegated meds. Former pt of Noemi Chapel.  Requested Prescriptions  Pending Prescriptions Disp Refills   ALPRAZolam (XANAX) 0.5 MG tablet [Pharmacy Med Name: ALPRAZOLAM 0.5 MG TABLET] 30 tablet     Sig: TAKE ONE TABLET BY MOUTH EVERY NIGHT AT BEDTIME AS NEEDED FOR SLEEP     Not Delegated - Psychiatry: Anxiolytics/Hypnotics 2 Failed - 07/30/2021  4:54 PM      Failed - This refill cannot be delegated      Failed - Urine Drug Screen completed in last 360 days      Passed - Patient is not pregnant      Passed - Valid encounter within last 6 months    Recent Outpatient Visits           4 months ago Anxiety and depression   [place] Eulogio Bear, NP   4 months ago [place] Eulogio Bear, NP   6 months ago Anxiety and depression   [place] Eulogio Bear, NP   10 months ago Anxiety and depression   [place] Eulogio Bear, NP   1 year ago Encounter for initial annual wellness visit (AWV) in Medicare patient   [place] Eulogio Bear, NP

## 2021-11-27 DIAGNOSIS — Z23 Encounter for immunization: Secondary | ICD-10-CM | POA: Diagnosis not present

## 2021-12-08 ENCOUNTER — Other Ambulatory Visit: Payer: Self-pay | Admitting: Nurse Practitioner

## 2021-12-08 DIAGNOSIS — F32A Depression, unspecified: Secondary | ICD-10-CM

## 2021-12-08 DIAGNOSIS — E785 Hyperlipidemia, unspecified: Secondary | ICD-10-CM

## 2021-12-23 DIAGNOSIS — E559 Vitamin D deficiency, unspecified: Secondary | ICD-10-CM | POA: Diagnosis not present

## 2021-12-23 DIAGNOSIS — E782 Mixed hyperlipidemia: Secondary | ICD-10-CM | POA: Diagnosis not present

## 2021-12-23 DIAGNOSIS — R7301 Impaired fasting glucose: Secondary | ICD-10-CM | POA: Diagnosis not present

## 2021-12-30 DIAGNOSIS — Z974 Presence of external hearing-aid: Secondary | ICD-10-CM | POA: Diagnosis not present

## 2021-12-30 DIAGNOSIS — R69 Illness, unspecified: Secondary | ICD-10-CM | POA: Diagnosis not present

## 2021-12-30 DIAGNOSIS — E782 Mixed hyperlipidemia: Secondary | ICD-10-CM | POA: Diagnosis not present

## 2021-12-30 DIAGNOSIS — I700 Atherosclerosis of aorta: Secondary | ICD-10-CM | POA: Diagnosis not present

## 2021-12-30 DIAGNOSIS — N393 Stress incontinence (female) (male): Secondary | ICD-10-CM | POA: Diagnosis not present

## 2021-12-30 DIAGNOSIS — G4700 Insomnia, unspecified: Secondary | ICD-10-CM | POA: Diagnosis not present

## 2021-12-30 DIAGNOSIS — Z716 Tobacco abuse counseling: Secondary | ICD-10-CM | POA: Diagnosis not present

## 2021-12-30 DIAGNOSIS — R7303 Prediabetes: Secondary | ICD-10-CM | POA: Diagnosis not present

## 2021-12-30 DIAGNOSIS — I10 Essential (primary) hypertension: Secondary | ICD-10-CM | POA: Diagnosis not present

## 2021-12-30 DIAGNOSIS — E559 Vitamin D deficiency, unspecified: Secondary | ICD-10-CM | POA: Diagnosis not present

## 2021-12-30 DIAGNOSIS — Z0001 Encounter for general adult medical examination with abnormal findings: Secondary | ICD-10-CM | POA: Diagnosis not present

## 2022-01-26 IMAGING — MG DIGITAL SCREENING BILAT W/ TOMO W/ CAD
6 of 10 series · 6 of 30 positions shown · non-contrast
Comparison: Previous exam(s).

CLINICAL DATA: Screening.

EXAM:
DIGITAL SCREENING BILATERAL MAMMOGRAM WITH TOMO AND CAD

[R CC synth-2D]
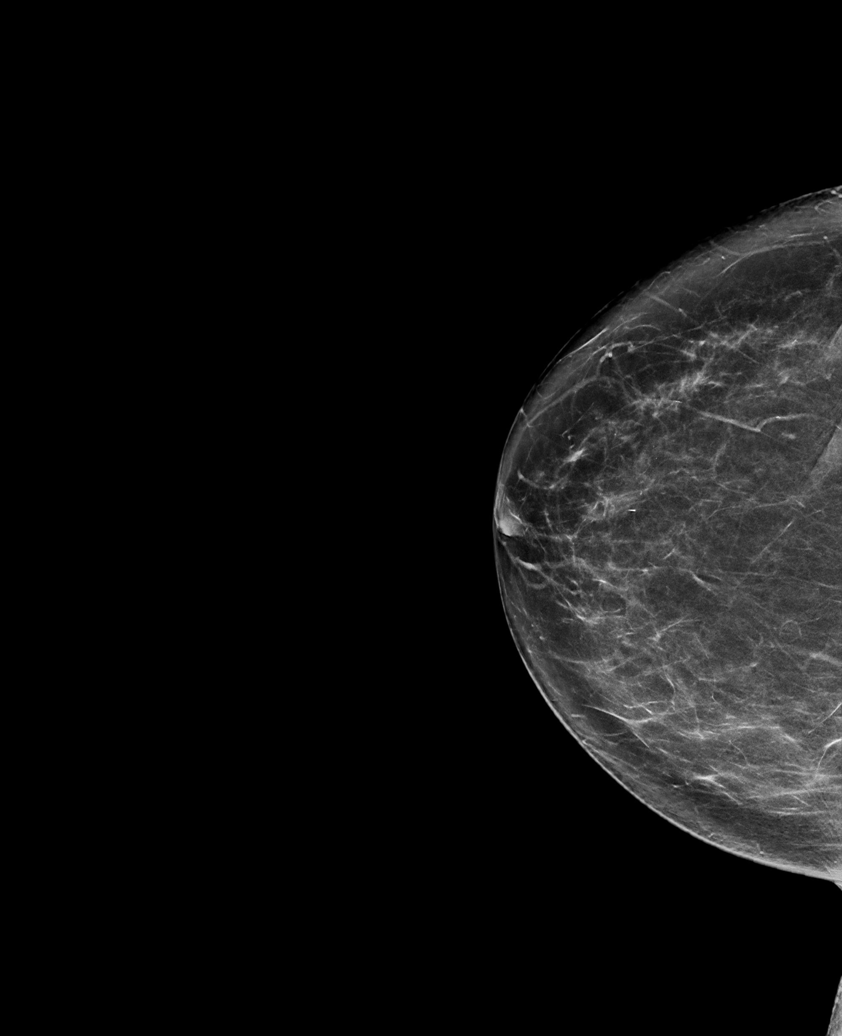

[R MLO synth-2D]
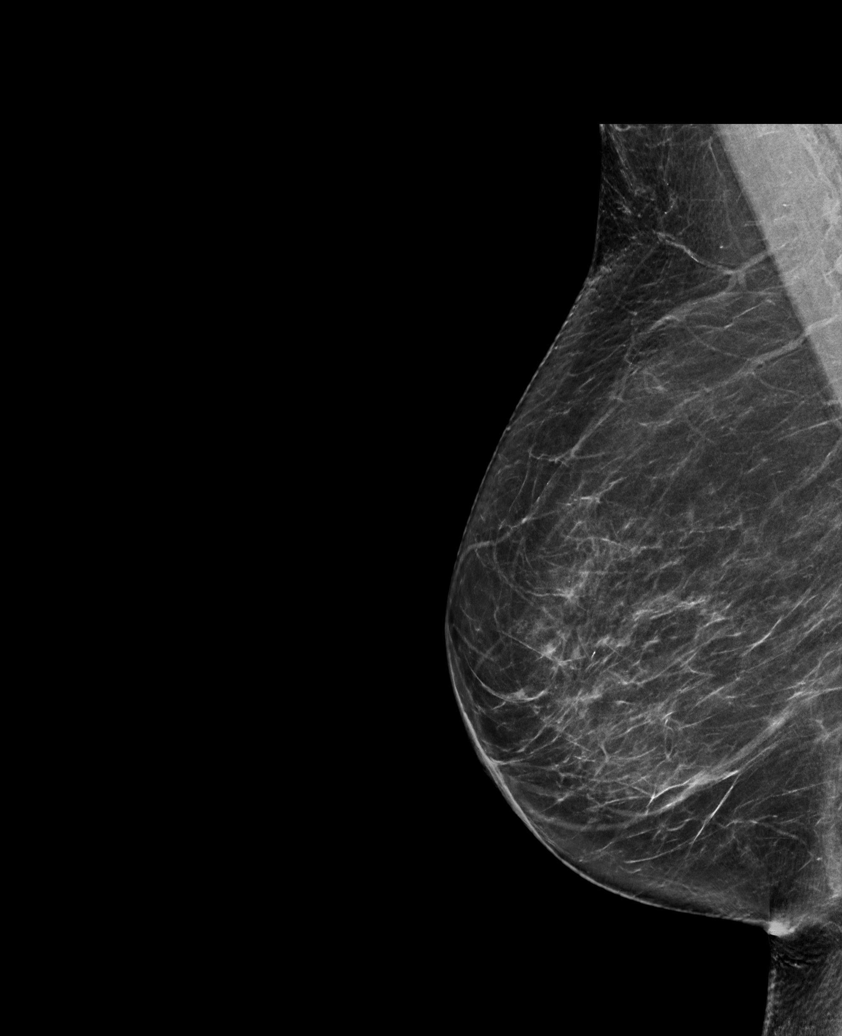

[L MLO synth-2D (1 of 2)]
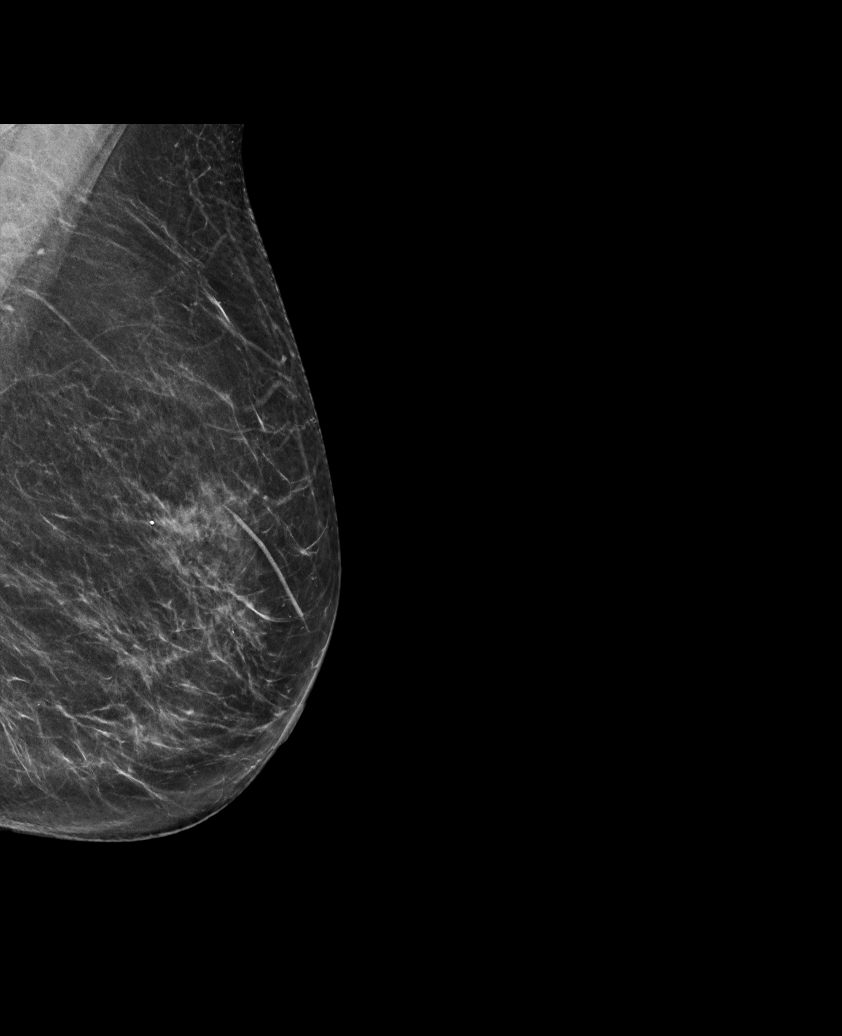

[L MLO synth-2D (2 of 2)]
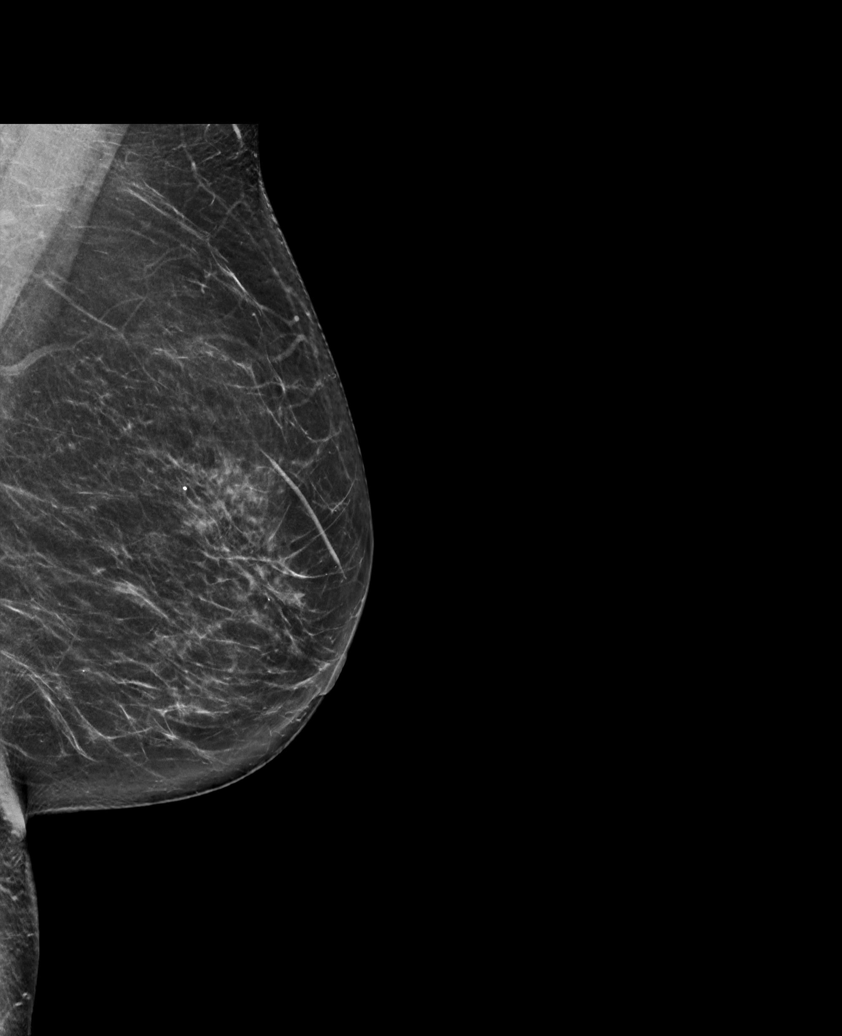

[L CC synth-2D]
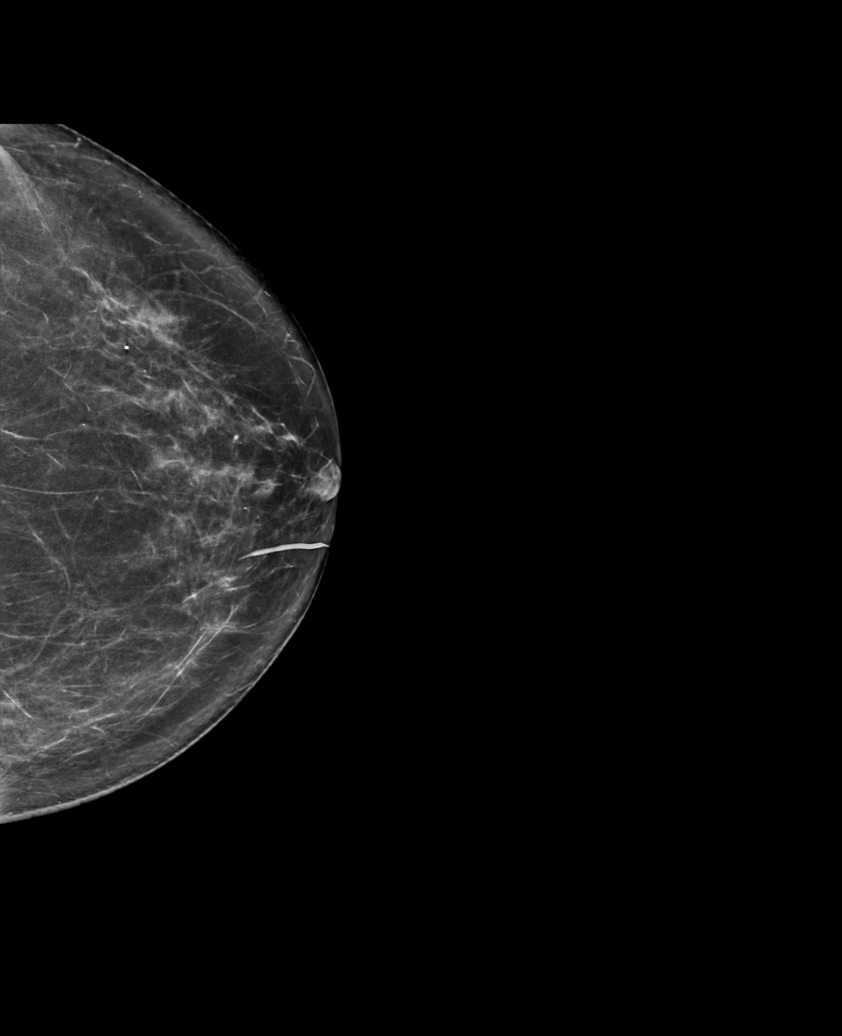

[R CC tomo · tomo slice 39/76.0]
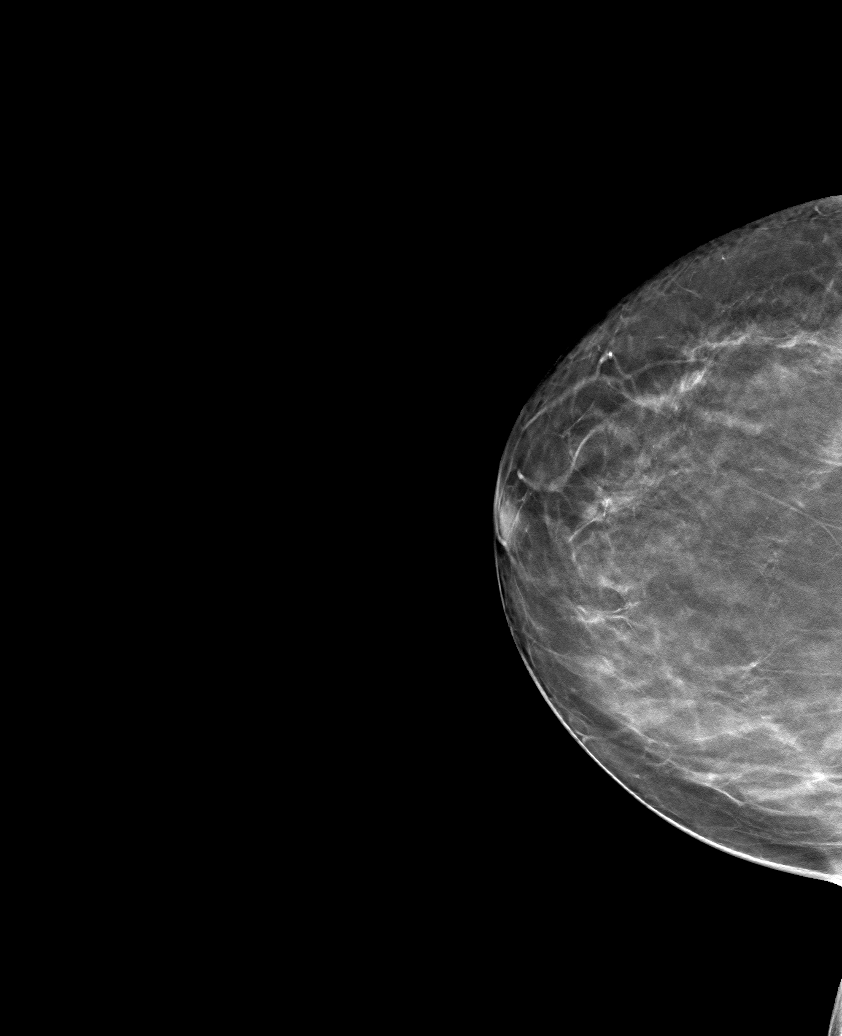

[6 of 30 positions shown; findings below may reference images not displayed]

ACR Breast Density Category b: There are scattered areas of
fibroglandular density.
FINDINGS: There are no findings suspicious for malignancy. Images were
processed with CAD.
IMPRESSION: No mammographic evidence of malignancy. A result letter of this
screening mammogram will be mailed directly to the patient.

RECOMMENDATION:
Screening mammogram in one year. (Code:[id number])

BI-RADS CATEGORY  1: Negative.

## 2022-03-06 ENCOUNTER — Other Ambulatory Visit: Payer: Self-pay | Admitting: Nurse Practitioner

## 2022-03-06 DIAGNOSIS — F5104 Psychophysiologic insomnia: Secondary | ICD-10-CM

## 2022-04-03 ENCOUNTER — Other Ambulatory Visit: Payer: Self-pay | Admitting: Family Medicine

## 2022-04-03 DIAGNOSIS — F5104 Psychophysiologic insomnia: Secondary | ICD-10-CM

## 2022-04-03 NOTE — Telephone Encounter (Signed)
Requested medications are due for refill today.  unsure  Requested medications are on the active medications list.  yes  Last refill. 03/06/2022 #30  Future visit scheduled.   no  Notes to clinic.  No pcp listed. Already given a courtesy refill. Refill not delegated.    Requested Prescriptions  Pending Prescriptions Disp Refills   ALPRAZolam (XANAX) 0.5 MG tablet [Pharmacy Med Name: ALPRAZOLAM 0.5 MG TABLET] 30 tablet     Sig: TAKE ONE TABLET BY MOUTH EVERY NIGHT AT BEDTIME AS NEEDED FOR SLEEP     Not Delegated - Psychiatry: Anxiolytics/Hypnotics 2 Failed - 04/03/2022 12:36 PM      Failed - This refill cannot be delegated      Failed - Urine Drug Screen completed in last 360 days      Failed - Valid encounter within last 6 months    Recent Outpatient Visits           1 year ago Anxiety and depression   [facility] Eulogio Bear, NP   1 year ago [place], Jessica A, NP   1 year ago Anxiety and depression   [place] Eulogio Bear, NP   1 year ago Anxiety and depression   [place] Eulogio Bear, NP   1 year ago Encounter for initial annual wellness visit (AWV) in Medicare patient   [place], [place], NP              Passed - Patient is not pregnant

## 2022-07-06 DIAGNOSIS — E782 Mixed hyperlipidemia: Secondary | ICD-10-CM | POA: Diagnosis not present

## 2022-07-06 DIAGNOSIS — E559 Vitamin D deficiency, unspecified: Secondary | ICD-10-CM | POA: Diagnosis not present

## 2022-07-06 DIAGNOSIS — R7303 Prediabetes: Secondary | ICD-10-CM | POA: Diagnosis not present

## 2022-07-06 DIAGNOSIS — I10 Essential (primary) hypertension: Secondary | ICD-10-CM | POA: Diagnosis not present

## 2022-07-10 DIAGNOSIS — F17200 Nicotine dependence, unspecified, uncomplicated: Secondary | ICD-10-CM | POA: Diagnosis not present

## 2022-07-10 DIAGNOSIS — E559 Vitamin D deficiency, unspecified: Secondary | ICD-10-CM | POA: Diagnosis not present

## 2022-07-10 DIAGNOSIS — I10 Essential (primary) hypertension: Secondary | ICD-10-CM | POA: Diagnosis not present

## 2022-07-10 DIAGNOSIS — I700 Atherosclerosis of aorta: Secondary | ICD-10-CM | POA: Diagnosis not present

## 2022-07-10 DIAGNOSIS — N393 Stress incontinence (female) (male): Secondary | ICD-10-CM | POA: Diagnosis not present

## 2022-07-10 DIAGNOSIS — H9192 Unspecified hearing loss, left ear: Secondary | ICD-10-CM | POA: Diagnosis not present

## 2022-07-10 DIAGNOSIS — G4700 Insomnia, unspecified: Secondary | ICD-10-CM | POA: Diagnosis not present

## 2022-07-10 DIAGNOSIS — E782 Mixed hyperlipidemia: Secondary | ICD-10-CM | POA: Diagnosis not present

## 2022-07-10 DIAGNOSIS — B353 Tinea pedis: Secondary | ICD-10-CM | POA: Diagnosis not present

## 2022-07-10 DIAGNOSIS — R7303 Prediabetes: Secondary | ICD-10-CM | POA: Diagnosis not present

## 2022-07-10 DIAGNOSIS — F411 Generalized anxiety disorder: Secondary | ICD-10-CM | POA: Diagnosis not present

## 2022-07-10 DIAGNOSIS — Z0001 Encounter for general adult medical examination with abnormal findings: Secondary | ICD-10-CM | POA: Diagnosis not present

## 2022-08-12 DIAGNOSIS — H00011 Hordeolum externum right upper eyelid: Secondary | ICD-10-CM | POA: Diagnosis not present

## 2022-10-01 DIAGNOSIS — U071 COVID-19: Secondary | ICD-10-CM | POA: Diagnosis not present

## 2022-12-14 DIAGNOSIS — Z23 Encounter for immunization: Secondary | ICD-10-CM | POA: Diagnosis not present

## 2023-01-06 DIAGNOSIS — E559 Vitamin D deficiency, unspecified: Secondary | ICD-10-CM | POA: Diagnosis not present

## 2023-01-06 DIAGNOSIS — E782 Mixed hyperlipidemia: Secondary | ICD-10-CM | POA: Diagnosis not present

## 2023-01-06 DIAGNOSIS — I10 Essential (primary) hypertension: Secondary | ICD-10-CM | POA: Diagnosis not present

## 2023-01-06 DIAGNOSIS — R7303 Prediabetes: Secondary | ICD-10-CM | POA: Diagnosis not present

## 2023-01-12 ENCOUNTER — Other Ambulatory Visit (HOSPITAL_COMMUNITY): Payer: Self-pay | Admitting: Family Medicine

## 2023-01-12 DIAGNOSIS — N393 Stress incontinence (female) (male): Secondary | ICD-10-CM | POA: Diagnosis not present

## 2023-01-12 DIAGNOSIS — E782 Mixed hyperlipidemia: Secondary | ICD-10-CM | POA: Diagnosis not present

## 2023-01-12 DIAGNOSIS — Z13820 Encounter for screening for osteoporosis: Secondary | ICD-10-CM

## 2023-01-12 DIAGNOSIS — F17200 Nicotine dependence, unspecified, uncomplicated: Secondary | ICD-10-CM | POA: Diagnosis not present

## 2023-01-12 DIAGNOSIS — Z974 Presence of external hearing-aid: Secondary | ICD-10-CM | POA: Diagnosis not present

## 2023-01-12 DIAGNOSIS — F411 Generalized anxiety disorder: Secondary | ICD-10-CM | POA: Diagnosis not present

## 2023-01-12 DIAGNOSIS — Z1231 Encounter for screening mammogram for malignant neoplasm of breast: Secondary | ICD-10-CM

## 2023-01-12 DIAGNOSIS — I129 Hypertensive chronic kidney disease with stage 1 through stage 4 chronic kidney disease, or unspecified chronic kidney disease: Secondary | ICD-10-CM | POA: Diagnosis not present

## 2023-01-12 DIAGNOSIS — R7303 Prediabetes: Secondary | ICD-10-CM | POA: Diagnosis not present

## 2023-01-12 DIAGNOSIS — I700 Atherosclerosis of aorta: Secondary | ICD-10-CM | POA: Diagnosis not present

## 2023-01-12 DIAGNOSIS — G4700 Insomnia, unspecified: Secondary | ICD-10-CM | POA: Diagnosis not present

## 2023-01-12 DIAGNOSIS — E559 Vitamin D deficiency, unspecified: Secondary | ICD-10-CM | POA: Diagnosis not present

## 2023-01-12 DIAGNOSIS — F5105 Insomnia due to other mental disorder: Secondary | ICD-10-CM | POA: Diagnosis not present

## 2023-01-12 DIAGNOSIS — F17210 Nicotine dependence, cigarettes, uncomplicated: Secondary | ICD-10-CM | POA: Diagnosis not present

## 2023-02-10 ENCOUNTER — Ambulatory Visit (HOSPITAL_COMMUNITY)
Admission: RE | Admit: 2023-02-10 | Discharge: 2023-02-10 | Disposition: A | Discharge status: Home / Self Care | Payer: No Typology Code available for payment source | Source: Ambulatory Visit | Attending: Family Medicine | Admitting: Family Medicine

## 2023-02-10 ENCOUNTER — Encounter (HOSPITAL_COMMUNITY): Payer: Self-pay

## 2023-02-10 DIAGNOSIS — Z122 Encounter for screening for malignant neoplasm of respiratory organs: Secondary | ICD-10-CM | POA: Diagnosis not present

## 2023-02-10 DIAGNOSIS — Z780 Asymptomatic menopausal state: Secondary | ICD-10-CM | POA: Diagnosis not present

## 2023-02-10 DIAGNOSIS — Z13820 Encounter for screening for osteoporosis: Secondary | ICD-10-CM | POA: Insufficient documentation

## 2023-02-10 DIAGNOSIS — Z1231 Encounter for screening mammogram for malignant neoplasm of breast: Secondary | ICD-10-CM | POA: Insufficient documentation

## 2023-02-10 DIAGNOSIS — F17200 Nicotine dependence, unspecified, uncomplicated: Secondary | ICD-10-CM

## 2023-02-10 DIAGNOSIS — F17210 Nicotine dependence, cigarettes, uncomplicated: Secondary | ICD-10-CM | POA: Insufficient documentation

## 2023-02-10 DIAGNOSIS — I2510 Atherosclerotic heart disease of native coronary artery without angina pectoris: Secondary | ICD-10-CM | POA: Insufficient documentation

## 2023-02-10 DIAGNOSIS — M810 Age-related osteoporosis without current pathological fracture: Secondary | ICD-10-CM | POA: Diagnosis not present

## 2023-02-10 DIAGNOSIS — I700 Atherosclerosis of aorta: Secondary | ICD-10-CM | POA: Diagnosis not present

## 2023-02-10 DIAGNOSIS — J439 Emphysema, unspecified: Secondary | ICD-10-CM | POA: Insufficient documentation

## 2023-05-15 ENCOUNTER — Emergency Department (HOSPITAL_COMMUNITY)

## 2023-05-15 ENCOUNTER — Emergency Department (HOSPITAL_COMMUNITY)
Admission: EM | Admit: 2023-05-15 | Discharge: 2023-05-15 | Disposition: A | Discharge status: Home / Self Care | Attending: Emergency Medicine | Admitting: Emergency Medicine

## 2023-05-15 DIAGNOSIS — F17210 Nicotine dependence, cigarettes, uncomplicated: Secondary | ICD-10-CM | POA: Diagnosis not present

## 2023-05-15 DIAGNOSIS — I700 Atherosclerosis of aorta: Secondary | ICD-10-CM | POA: Insufficient documentation

## 2023-05-15 DIAGNOSIS — F444 Conversion disorder with motor symptom or deficit: Secondary | ICD-10-CM | POA: Diagnosis not present

## 2023-05-15 DIAGNOSIS — H9190 Unspecified hearing loss, unspecified ear: Secondary | ICD-10-CM | POA: Insufficient documentation

## 2023-05-15 DIAGNOSIS — I6501 Occlusion and stenosis of right vertebral artery: Secondary | ICD-10-CM | POA: Insufficient documentation

## 2023-05-15 DIAGNOSIS — R4781 Slurred speech: Secondary | ICD-10-CM | POA: Insufficient documentation

## 2023-05-15 DIAGNOSIS — I6782 Cerebral ischemia: Secondary | ICD-10-CM | POA: Insufficient documentation

## 2023-05-15 DIAGNOSIS — R519 Headache, unspecified: Secondary | ICD-10-CM | POA: Insufficient documentation

## 2023-05-15 DIAGNOSIS — R4182 Altered mental status, unspecified: Secondary | ICD-10-CM | POA: Insufficient documentation

## 2023-05-15 DIAGNOSIS — R42 Dizziness and giddiness: Secondary | ICD-10-CM | POA: Insufficient documentation

## 2023-05-15 DIAGNOSIS — M47812 Spondylosis without myelopathy or radiculopathy, cervical region: Secondary | ICD-10-CM | POA: Insufficient documentation

## 2023-05-15 DIAGNOSIS — R531 Weakness: Secondary | ICD-10-CM | POA: Insufficient documentation

## 2023-05-15 DIAGNOSIS — G4489 Other headache syndrome: Secondary | ICD-10-CM | POA: Diagnosis not present

## 2023-05-15 DIAGNOSIS — R55 Syncope and collapse: Secondary | ICD-10-CM | POA: Diagnosis not present

## 2023-05-15 DIAGNOSIS — R29898 Other symptoms and signs involving the musculoskeletal system: Secondary | ICD-10-CM | POA: Diagnosis not present

## 2023-05-15 DIAGNOSIS — I6523 Occlusion and stenosis of bilateral carotid arteries: Secondary | ICD-10-CM | POA: Diagnosis not present

## 2023-05-15 DIAGNOSIS — R29818 Other symptoms and signs involving the nervous system: Secondary | ICD-10-CM | POA: Diagnosis not present

## 2023-05-15 LAB — COMPREHENSIVE METABOLIC PANEL
ALT: 14 U/L (ref 0–44)
AST: 19 U/L (ref 15–41)
Albumin: 3.9 g/dL (ref 3.5–5.0)
Alkaline Phosphatase: 45 U/L (ref 38–126)
Anion gap: 10 (ref 5–15)
BUN: 14 mg/dL (ref 8–23)
CO2: 24 mmol/L (ref 22–32)
Calcium: 9.9 mg/dL (ref 8.9–10.3)
Chloride: 104 mmol/L (ref 98–111)
Creatinine, Ser: 1.44 mg/dL — ABNORMAL HIGH (ref 0.44–1.00)
GFR, Estimated: 38 mL/min — ABNORMAL LOW (ref >60)
Glucose, Bld: 129 mg/dL — ABNORMAL HIGH (ref 70–99)
Potassium: 3.5 mmol/L (ref 3.5–5.1)
Sodium: 138 mmol/L (ref 135–145)
Total Bilirubin: 0.7 mg/dL (ref 0.0–1.2)
Total Protein: 6.7 g/dL (ref 6.5–8.1)

## 2023-05-15 LAB — DIFFERENTIAL
Abs Immature Granulocytes: 0.03 10*3/uL (ref 0.00–0.07)
Basophils Absolute: 0.1 10*3/uL (ref 0.0–0.1)
Basophils Relative: 1 %
Eosinophils Absolute: 0.2 10*3/uL (ref 0.0–0.5)
Eosinophils Relative: 3 %
Immature Granulocytes: 0 %
Lymphocytes Relative: 32 %
Lymphs Abs: 2.3 10*3/uL (ref 0.7–4.0)
Monocytes Absolute: 0.4 10*3/uL (ref 0.1–1.0)
Monocytes Relative: 5 %
Neutro Abs: 4.3 10*3/uL (ref 1.7–7.7)
Neutrophils Relative %: 59 %

## 2023-05-15 LAB — CBC
HCT: 41.5 % (ref 36.0–46.0)
Hemoglobin: 13.8 g/dL (ref 12.0–15.0)
MCH: 30.1 pg (ref 26.0–34.0)
MCHC: 33.3 g/dL (ref 30.0–36.0)
MCV: 90.6 fL (ref 80.0–100.0)
Platelets: 231 10*3/uL (ref 150–400)
RBC: 4.58 MIL/uL (ref 3.87–5.11)
RDW: 12.7 % (ref 11.5–15.5)
WBC: 7.3 10*3/uL (ref 4.0–10.5)
nRBC: 0 % (ref 0.0–0.2)

## 2023-05-15 LAB — I-STAT CHEM 8, ED
BUN: 14 mg/dL (ref 8–23)
Calcium, Ion: 1.19 mmol/L (ref 1.15–1.40)
Chloride: 106 mmol/L (ref 98–111)
Creatinine, Ser: 1.4 mg/dL — ABNORMAL HIGH (ref 0.44–1.00)
Glucose, Bld: 123 mg/dL — ABNORMAL HIGH (ref 70–99)
HCT: 42 % (ref 36.0–46.0)
Hemoglobin: 14.3 g/dL (ref 12.0–15.0)
Potassium: 3.6 mmol/L (ref 3.5–5.1)
Sodium: 142 mmol/L (ref 135–145)
TCO2: 25 mmol/L (ref 22–32)

## 2023-05-15 LAB — CBG MONITORING, ED: Glucose-Capillary: 130 mg/dL — ABNORMAL HIGH (ref 70–99)

## 2023-05-15 LAB — PROTIME-INR
INR: 0.9 (ref 0.8–1.2)
Prothrombin Time: 12.2 s (ref 11.4–15.2)

## 2023-05-15 LAB — APTT: aPTT: 29 s (ref 24–36)

## 2023-05-15 LAB — ETHANOL: Alcohol, Ethyl (B): <10 mg/dL (ref <10)

## 2023-05-15 MED ORDER — IOHEXOL 350 MG/ML SOLN
75.0000 mL | Freq: Once | INTRAVENOUS | Status: AC | PRN
  Administered 2023-05-15: 75 mL via INTRAVENOUS

## 2023-05-15 MED ORDER — HYDRALAZINE HCL 20 MG/ML IJ SOLN: 5.0000 mg | Freq: Once | INTRAMUSCULAR | Status: DC

## 2023-05-15 NOTE — ED Triage Notes (Signed)
 Pt began having severe headache at approx 1930 and not acting like herself according to family. Upon EMS arrival, pt was aphasic and "passed oot" anytime she sat up due to dizziness. Pt was slow to follow commands according to EMS and enroute complained of right leg nimbness, but this quickly subsided. Upon arrival to ED pt complained of generalized weakness but decreased sensitivity on right leg and left arm. 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

## 2023-05-15 NOTE — ED Notes (Signed)
 This RN remains with pt in CT and MRI

## 2023-05-15 NOTE — ED Notes (Signed)
 Patient transported to MRI

## 2023-05-15 NOTE — Consult Note (Signed)
 NEUROLOGY CONSULT NOTE   Date of service: May 15, 2023 Patient Name: Sonia Oliver MRN:  [id number] DOB:  12/24/1950 Chief Complaint: "Code stroke-slurred speech" Requesting Provider: Glyn Ade, MD  History of Present Illness  Sonia Oliver is a 73 y.o. female with hx of anxiety, deafness, prior history of strokelike symptoms likely related to conversion disorder versus anxiety, presenting for evaluation of sudden onset of slurred speech that started after headache.  Last known well 7:30 PM.  Symptoms started to surface around 7:45 PM. She also started complaining of numbness and weakness in her legs.  EMS also noted that she was acting somewhat confused on the way.  They activated a code stroke initially non-LVO positive but then said the LVO score went up to 5 and then dropped back down to 0/1.  Her blood pressures were in the 180s.  On arrival her highest blood pressure was 175 systolic. She has had inconsistent exam throughout her transport. Seen as a emergent code stroke.  Exam definitely was not localizable-see details below. Noncontrast head CT unremarkable-with the caveat that there was some question of artifact versus stroke in the pons.  CT angiography head and neck unremarkable. Taken for stat MRI so as to not be missing a stroke-which was also unremarkable  LKW: 7:30 PM Modified rankin score: 0-Completely asymptomatic and back to baseline post- stroke IV Thrombolysis: MRI negative for stroke EVT: MRI negative for stroke  NIHSS components Score: Comment  1a Level of Conscious 0[x]  1[]  2[]  3[]      1b LOC Questions 0[x]  1[]  2[]       1c LOC Commands 0[x]  1[]  2[]       2 Best Gaze 0[x]  1[]  2[]       3 Visual 0[x]  1[]  2[]  3[]      4 Facial Palsy 0[x]  1[]  2[]  3[]      5a Motor Arm - left 0[]  1[x]  2[]  3[]  4[]  UN[]    5b Motor Arm - Right 0[]  1[x]  2[]  3[]  4[]  UN[]    6a Motor Leg - Left 0[]  1[]  2[]  3[x]  4[]  UN[]    6b Motor Leg - Right 0[]  1[]  2[]  3[x]  4[]  UN[]    7 Limb Ataxia  0[x]  1[]  2[]  3[]  UN[]     8 Sensory 0[]  1[]  2[x]  UN[]      9 Best Language 0[x]  1[]  2[]  3[]      10 Dysarthria 0[x]  1[]  2[]  UN[]      11 Extinct. and Inattention 0[x]  1[]  2[]       TOTAL: 10      ROS  Comprehensive ROS performed and pertinent positives documented in HPI    Past History   Past Medical History:  Diagnosis Date   Anxiety    Cancer (HCC) 11/11/2009   Deaf    Hemoptysis 03/15/2020   Hyperlipidemia    Stroke (HCC) 04/19/2014    Past Surgical History:  Procedure Laterality Date   ABDOMINAL HYSTERECTOMY     BREAST CYST EXCISION Left    COLON SURGERY      Family History: Family History  Problem Relation Age of Onset   Arthritis Mother    Diabetes Mother    Stroke Mother    Early death Father    Heart disease Father    Arthritis Sister    Hyperlipidemia Sister    Hypertension Sister    Early death Sister    Depression Sister    Drug abuse Sister    Breast cancer Sister        45's or ealy 100's  Hyperlipidemia Brother    Hypertension Brother    Early death Brother    Arthritis Sister    Hyperlipidemia Sister    Hypertension Sister    Depression Sister    Drug abuse Sister    Arthritis Sister    Hyperlipidemia Sister    Hypertension Sister    Depression Sister    Drug abuse Sister    Breast cancer Maternal Aunt     Social History  reports that she has been smoking cigarettes. She started smoking about 60 years ago. She has a 30.1 pack-year smoking history. She has never used smokeless tobacco. She reports that she does not drink alcohol and does not use drugs.  Allergies  Allergen Reactions   Codeine Shortness Of Breath   Sulfa Antibiotics Nausea Only   Other Other (See Comments) and Rash    Medications  No current facility-administered medications for this encounter.  Current Outpatient Medications:    ALPRAZolam (XANAX) 0.5 MG tablet, TAKE ONE TABLET BY MOUTH AT BEDTIME AS NEEDED FOR SLEEP, Disp: 30 tablet, Rfl: 0   aspirin EC 81 MG  tablet, Take 81 mg by mouth daily., Disp: , Rfl:    atorvastatin (LIPITOR) 20 MG tablet, Take 1 tablet (20 mg total) by mouth daily., Disp: 90 tablet, Rfl: 1   escitalopram (LEXAPRO) 20 MG tablet, Take 1 tablet (20 mg total) by mouth daily., Disp: 90 tablet, Rfl: 1   fexofenadine (ALLEGRA) 180 MG tablet, Take 1 tablet (180 mg total) by mouth daily., Disp: 90 tablet, Rfl: 1  Vitals   Vitals:   06-02-2023 2000  Weight: 66.4 kg    Body mass index is 23.63 kg/m.  Physical Exam   General: Awake alert in no distress HEENT: Normocephalic atraumatic Lungs: Clear Cardiovascular: Regular rate rhythm Neurological exam She is awake alert oriented to self, the fact that she is in the hospital, was able to tell me her medication list pretty reliably. Speech is not dysarthric but has a bit of a stutter intermittently. She is very hard of hearing and I do not think she has any aphasia or inability to follow commands but the difficulty is her hearing and when spoken with towards her good right ear, she follows commands pretty consistently. Cranial nerves II to XII appear intact Motor examination with mild drift in both upper extremities which seems very effort dependent.  She could not raise both lower extremities antigravity in spite of coaching but that also seemed very effort dependent. Sensation diminished on the left leg and right arm. Coordination difficult to assess  Labs/Imaging/Neurodiagnostic studies   CBC:  Recent Labs  Lab Jun 02, 2023 2045 02-Jun-2023 2050  WBC 7.3  --   NEUTROABS 4.3  --   HGB 13.8 14.3  HCT 41.5 42.0  MCV 90.6  --   PLT 231  --    Basic Metabolic Panel:  Lab Results  Component Value Date   NA 142 06-02-23   K 3.6 2023/06/02   CO2 30 12/30/2020   GLUCOSE 123 (H) 06/02/2023   BUN 14 06/02/23   CREATININE 1.40 (H) 06/02/23   CALCIUM 9.3 12/30/2020   GFRNONAA 62 06/24/2020   GFRAA 72 06/24/2020   Lipid Panel:  Lab Results  Component Value Date    LDLCALC 75 12/30/2020   HgbA1c:  Lab Results  Component Value Date   HGBA1C 6.1 (H) 04/20/2014   Urine Drug Screen:     Component Value Date/Time   LABOPIA NONE DETECTED 04/19/2014 2030  COCAINSCRNUR NONE DETECTED 04/19/2014 2030   LABBENZ NONE DETECTED 04/19/2014 2030   AMPHETMU NONE DETECTED 04/19/2014 2030   THCU NONE DETECTED 04/19/2014 2030   LABBARB NONE DETECTED 04/19/2014 2030    Alcohol Level     Component Value Date/Time   ETH <5 04/19/2014 1938   INR  Lab Results  Component Value Date   INR 0.9 05/15/2023   APTT  Lab Results  Component Value Date   APTT 29 05/15/2023    CT Head without contrast(Personally reviewed): Unremarkable with the caveat that there is a artifact versus concern for stroke in the pons area  CT angio Head and Neck with contrast(Personally reviewed): No emergent large vessel occlusion.  Right vertebral V3 segment occlusion-likely chronic.   MRI Brain(Personally reviewed): Negative for acute stroke.  Chronic thalamic and white matter changes.   ASSESSMENT   Sonia Oliver is a 73 y.o. female with above past medical history presenting for evaluation of multiple symptoms that started after a headache that included numbness and weakness of multiple extremities with an effort dependent inconsistent exam. Symptoms likely related either to hypertensive urgency or functional neurological disorder with the weakness and anesthesia No evidence of acute stroke on MRI imaging.   RECOMMENDATIONS  No further workup from a neurological standpoint is needed inpatient. Management of blood pressures per primary team. Neurology will be available as needed. Plan was discussed with Dr. Doran Durand ______________________________________________________________________    Signed, Milon Dikes, MD [facility]

## 2023-05-15 NOTE — ED Notes (Signed)
 Pt's husband at bedside.

## 2023-05-15 NOTE — ED Notes (Signed)
 Pt assisted with bedpan and is able to bend both knees and lift hips without difficulty. Pt also able to slide self from stretcher to MRI table without assistance.

## 2023-05-15 NOTE — ED Provider Notes (Signed)
 [place] EMERGENCY DEPARTMENT AT [facility] Provider Note   CSN: [id number] Arrival date & time: 05/15/23  2044     History Chief Complaint  Patient presents with   Code Stroke    HPI Sonia Oliver is a 73 y.o. female presenting for chief complaint of altered mental status and severe headache. Happened at 730.  She had multiple neurologic symptoms including right-sided weakness, dizziness. Patient activated a code stroke in transfer due to the sensations and being within treatment window for stroke activation..   Patient's recorded medical, surgical, social, medication list and allergies were reviewed in the Snapshot window as part of the initial history.   Review of Systems   Review of Systems  Constitutional:  Negative for chills and fever.  HENT:  Negative for ear pain and sore throat.   Eyes:  Negative for pain and visual disturbance.  Respiratory:  Negative for cough and shortness of breath.   Cardiovascular:  Negative for chest pain and palpitations.  Gastrointestinal:  Negative for abdominal pain and vomiting.  Genitourinary:  Negative for dysuria and hematuria.  Musculoskeletal:  Negative for arthralgias and back pain.  Skin:  Negative for color change and rash.  Neurological:  Positive for dizziness, weakness and light-headedness. Negative for seizures and syncope.  All other systems reviewed and are negative.   Physical Exam Updated Vital Signs BP 134/64   Pulse 61   Temp 98 F (36.7 C) (Oral)   Resp 13   Wt 66.4 kg   SpO2 97%   BMI 23.63 kg/m  Physical Exam Constitutional:      General: She is not in acute distress.    Appearance: She is not ill-appearing or toxic-appearing.  HENT:     Head: Normocephalic and atraumatic.  Eyes:     Extraocular Movements: Extraocular movements intact.     Pupils: Pupils are equal, round, and reactive to light.  Cardiovascular:     Rate and Rhythm: Normal rate.  Pulmonary:     Effort: No respiratory  distress.  Abdominal:     General: Abdomen is flat.  Musculoskeletal:        General: No swelling, deformity or signs of injury.     Cervical back: Normal range of motion. No rigidity.  Skin:    General: Skin is warm and dry.  Neurological:     Mental Status: She is alert.     Comments: Agree with exam per neurology   Psychiatric:        Mood and Affect: Mood normal.      ED Course/ Medical Decision Making/ A&P    Procedures Procedures   Medications Ordered in ED Medications  iohexol (OMNIPAQUE) 350 MG/ML injection 75 mL (75 mLs Intravenous Contrast Given 05/15/23 2110)   Medical Decision Making:    Sonia Oliver is a 73 y.o. female who presented to the ED today with weakness and dizziness.  Due to these symptoms, nursing activated a CODE STROKE per hospital protocol.   Patient placed on continuous vitals and telemetry monitoring while in ED which was reviewed periodically.   On my initial exam, the pt was in no acute distress, glucose was WNL and deficits include dizziness.  Deficits are  persistent.   Reviewed and confirmed nursing documentation for past medical history, family history, social history.   Initial Assessment and Plan:   Patient immediately evaluated jointly by neurology and emergency department providers.   This is most consistent with an acute life/limb threatening illness  complicated by underlying chronic conditions.  Patient evaluated per code stroke protocol with immediate cross-sectional imaging of the head via CT head to evaluate for intracranial hemorrhage.  This was augmented with CT angiography and perfusional imaging of the brain for further evaluation of large vessel occlusion.   Per neurology, these rapid studies revealed no acute pathology. Neurology feels that patient's presentation is more consistent with alternative pathology given these findings.  Differential includes metabolic encephalopathy, medication encephalopathy, infectious  encephalopathy. Neurology has recommended an MRI for further differentiation of patient's syndrome and evaluation for any ischemic disease while completing a metabolic/infectious workup with laboratory evaluation per EMR.  Initial Study Results: Labs Labs reviewed without evidence of clinically relevant abnormality.  EKG EKG was reviewed independently. Rate, rhythm, axis, intervals all examined and without medically relevant abnormality. ST segments without concerns for elevations.    Radiology  Images reviewed independently, agree with radiology report at this time.   MR BRAIN WO CONTRAST  Final Result    CT ANGIO HEAD NECK W WO CM (CODE STROKE)  Final Result    CT HEAD CODE STROKE WO CONTRAST  Final Result        Final Assessment and Plan:   MRI results with no focal pathology.  All symptoms remain resolved throughout the evaluation window. On my reassessment, her symptoms are continuously grossly resolved. Possible hypertensive urgency episode though also unlikely based on lack of findings on imaging. Given resolution of syndrome, recommended patient follow-up in the outpatient setting with neurology for further diagnostic care management strict return precautions reinforced. Patient requesting discharge given resolution of symptoms.  Disposition:  I have considered need for hospitalization, however, considering all of the above, I believe this patient is stable for discharge at this time.  Patient/family educated about specific return precautions for given chief complaint and symptoms.  Patient/family educated about follow-up with PCP.     Patient/family expressed understanding of return precautions and need for follow-up. Patient spoken to regarding all imaging and laboratory results and appropriate follow up for these results. All education provided in verbal form with additional information in written form. Time was allowed for answering of patient questions. Patient  discharged.    Emergency Department Medication Summary:   Medications  iohexol (OMNIPAQUE) 350 MG/ML injection 75 mL (75 mLs Intravenous Contrast Given 05/15/23 2110)    Clinical Impression:  1. Altered mental status, unspecified altered mental status type      Discharge   Final Clinical Impression(s) / ED Diagnoses Final diagnoses:  Altered mental status, unspecified altered mental status type    Rx / DC Orders ED Discharge Orders     None         Glyn Ade, MD 05/15/23 2302

## 2023-05-15 NOTE — ED Notes (Signed)
 Patient transported to CT

## 2023-05-19 DIAGNOSIS — F411 Generalized anxiety disorder: Secondary | ICD-10-CM | POA: Diagnosis not present

## 2023-05-19 DIAGNOSIS — F17210 Nicotine dependence, cigarettes, uncomplicated: Secondary | ICD-10-CM | POA: Diagnosis not present

## 2023-05-19 DIAGNOSIS — Z7689 Persons encountering health services in other specified circumstances: Secondary | ICD-10-CM | POA: Diagnosis not present

## 2023-07-08 DIAGNOSIS — I10 Essential (primary) hypertension: Secondary | ICD-10-CM | POA: Diagnosis not present

## 2023-07-08 DIAGNOSIS — E559 Vitamin D deficiency, unspecified: Secondary | ICD-10-CM | POA: Diagnosis not present

## 2023-07-08 DIAGNOSIS — E782 Mixed hyperlipidemia: Secondary | ICD-10-CM | POA: Diagnosis not present

## 2023-07-08 DIAGNOSIS — R7303 Prediabetes: Secondary | ICD-10-CM | POA: Diagnosis not present

## 2023-07-14 DIAGNOSIS — N183 Chronic kidney disease, stage 3 unspecified: Secondary | ICD-10-CM | POA: Diagnosis not present

## 2023-07-14 DIAGNOSIS — N393 Stress incontinence (female) (male): Secondary | ICD-10-CM | POA: Diagnosis not present

## 2023-07-14 DIAGNOSIS — G4700 Insomnia, unspecified: Secondary | ICD-10-CM | POA: Diagnosis not present

## 2023-07-14 DIAGNOSIS — F17200 Nicotine dependence, unspecified, uncomplicated: Secondary | ICD-10-CM | POA: Diagnosis not present

## 2023-07-14 DIAGNOSIS — I129 Hypertensive chronic kidney disease with stage 1 through stage 4 chronic kidney disease, or unspecified chronic kidney disease: Secondary | ICD-10-CM | POA: Diagnosis not present

## 2023-07-14 DIAGNOSIS — I700 Atherosclerosis of aorta: Secondary | ICD-10-CM | POA: Diagnosis not present

## 2023-07-14 DIAGNOSIS — Z974 Presence of external hearing-aid: Secondary | ICD-10-CM | POA: Diagnosis not present

## 2023-07-14 DIAGNOSIS — R7303 Prediabetes: Secondary | ICD-10-CM | POA: Diagnosis not present

## 2023-07-14 DIAGNOSIS — H9192 Unspecified hearing loss, left ear: Secondary | ICD-10-CM | POA: Diagnosis not present

## 2023-07-14 DIAGNOSIS — E782 Mixed hyperlipidemia: Secondary | ICD-10-CM | POA: Diagnosis not present

## 2023-07-14 DIAGNOSIS — E559 Vitamin D deficiency, unspecified: Secondary | ICD-10-CM | POA: Diagnosis not present

## 2023-07-14 DIAGNOSIS — F411 Generalized anxiety disorder: Secondary | ICD-10-CM | POA: Diagnosis not present

## 2023-12-09 ENCOUNTER — Emergency Department (HOSPITAL_BASED_OUTPATIENT_CLINIC_OR_DEPARTMENT_OTHER)

## 2023-12-09 ENCOUNTER — Other Ambulatory Visit: Payer: Self-pay

## 2023-12-09 ENCOUNTER — Emergency Department (HOSPITAL_BASED_OUTPATIENT_CLINIC_OR_DEPARTMENT_OTHER)
Admission: EM | Admit: 2023-12-09 | Discharge: 2023-12-09 | Disposition: A | Discharge status: Home / Self Care | Attending: Emergency Medicine | Admitting: Emergency Medicine

## 2023-12-09 DIAGNOSIS — Z7982 Long term (current) use of aspirin: Secondary | ICD-10-CM | POA: Diagnosis not present

## 2023-12-09 DIAGNOSIS — W19XXXA Unspecified fall, initial encounter: Secondary | ICD-10-CM

## 2023-12-09 DIAGNOSIS — S0990XA Unspecified injury of head, initial encounter: Secondary | ICD-10-CM | POA: Diagnosis not present

## 2023-12-09 DIAGNOSIS — W1839XA Other fall on same level, initial encounter: Secondary | ICD-10-CM | POA: Diagnosis not present

## 2023-12-09 DIAGNOSIS — I10 Essential (primary) hypertension: Secondary | ICD-10-CM | POA: Diagnosis not present

## 2023-12-09 DIAGNOSIS — R001 Bradycardia, unspecified: Secondary | ICD-10-CM | POA: Diagnosis not present

## 2023-12-09 DIAGNOSIS — Z79899 Other long term (current) drug therapy: Secondary | ICD-10-CM | POA: Insufficient documentation

## 2023-12-09 LAB — COMPREHENSIVE METABOLIC PANEL WITH GFR
ALT: 10 U/L (ref 0–44)
AST: 18 U/L (ref 15–41)
Albumin: 4.4 g/dL (ref 3.5–5.0)
Alkaline Phosphatase: 55 U/L (ref 38–126)
Anion gap: 9 (ref 5–15)
BUN: 12 mg/dL (ref 8–23)
CO2: 27 mmol/L (ref 22–32)
Calcium: 9.5 mg/dL (ref 8.9–10.3)
Chloride: 106 mmol/L (ref 98–111)
Creatinine, Ser: 1.05 mg/dL — ABNORMAL HIGH (ref 0.44–1.00)
GFR, Estimated: 56 mL/min — ABNORMAL LOW (ref >60)
Glucose, Bld: 86 mg/dL (ref 70–99)
Potassium: 4 mmol/L (ref 3.5–5.1)
Sodium: 141 mmol/L (ref 135–145)
Total Bilirubin: 0.6 mg/dL (ref 0.0–1.2)
Total Protein: 7 g/dL (ref 6.5–8.1)

## 2023-12-09 LAB — CBC WITH DIFFERENTIAL/PLATELET
Abs Immature Granulocytes: 0.02 K/uL (ref 0.00–0.07)
Basophils Absolute: 0.1 K/uL (ref 0.0–0.1)
Basophils Relative: 1 %
Eosinophils Absolute: 0.2 K/uL (ref 0.0–0.5)
Eosinophils Relative: 2 %
HCT: 42.1 % (ref 36.0–46.0)
Hemoglobin: 13.7 g/dL (ref 12.0–15.0)
Immature Granulocytes: 0 %
Lymphocytes Relative: 35 %
Lymphs Abs: 2.7 K/uL (ref 0.7–4.0)
MCH: 29.8 pg (ref 26.0–34.0)
MCHC: 32.5 g/dL (ref 30.0–36.0)
MCV: 91.5 fL (ref 80.0–100.0)
Monocytes Absolute: 0.5 K/uL (ref 0.1–1.0)
Monocytes Relative: 7 %
Neutro Abs: 4.3 K/uL (ref 1.7–7.7)
Neutrophils Relative %: 55 %
Platelets: 238 K/uL (ref 150–400)
RBC: 4.6 MIL/uL (ref 3.87–5.11)
RDW: 13.3 % (ref 11.5–15.5)
WBC: 7.7 K/uL (ref 4.0–10.5)
nRBC: 0 % (ref 0.0–0.2)

## 2023-12-09 LAB — MAGNESIUM: Magnesium: 2.4 mg/dL (ref 1.7–2.4)

## 2023-12-09 LAB — TSH: TSH: 2.64 u[IU]/mL (ref 0.350–4.500)

## 2023-12-09 LAB — TROPONIN T, HIGH SENSITIVITY: Troponin T High Sensitivity: <15 ng/L (ref 0–19)

## 2023-12-09 NOTE — ED Provider Notes (Signed)
 [place] EMERGENCY DEPARTMENT AT [facility] Provider Note   CSN: [id number] Arrival date & time: 12/09/23  1331     Patient presents with: Sonia Oliver   Sonia Oliver is a 73 y.o. female.   73 year old female with a past medical history of hypertension, stroke, anxiety presents to the ED status post mechanical fall.  Patient reports yesterday she was fishing with her significant other at the bedside, she was standing on the boat when suddenly she stepped off a 2 foot drop.  She reports she fell backwards striking the right side of her head.  She did not lose consciousness.  She was able to stand after the incident occurred.  She began to feel concerned as she woke up this morning with a generalized headache, did not take any medication and she was concerned that something was off.  She did have an episode of blurry vision after the fall occurred, had some blurry vision this morning but this has significantly improved.  She states I know I need to go get glasses .  She is currently not on any blood thinners but does take a daily aspirin .  She denies any other complaints at this time, no dizziness or weakness.     The history is provided by the patient.  Fall Associated symptoms include headaches. Pertinent negatives include no chest pain, no abdominal pain and no shortness of breath.       Prior to Admission medications   Medication Sig Start Date End Date Taking? Authorizing Provider  ALPRAZolam  (XANAX ) 0.5 MG tablet TAKE ONE TABLET BY MOUTH AT BEDTIME AS NEEDED FOR SLEEP 03/06/22   Duanne Butler DASEN, MD  aspirin  EC 81 MG tablet Take 81 mg by mouth daily.    [provider]  atorvastatin  (LIPITOR) 20 MG tablet Take 1 tablet (20 mg total) by mouth daily. 03/17/21   Chandra Harlene LABOR, NP  escitalopram  (LEXAPRO ) 20 MG tablet Take 1 tablet (20 mg total) by mouth daily. 03/17/21   Chandra Harlene LABOR, NP  fexofenadine  (ALLEGRA ) 180 MG tablet Take 1 tablet (180 mg total) by  mouth daily. 07/27/18   Bari Theodoro FALCON, MD    Allergies: Codeine, Sulfa antibiotics, and Other    Review of Systems  Constitutional:  Negative for fever.  Respiratory:  Negative for shortness of breath.   Cardiovascular:  Negative for chest pain.  Gastrointestinal:  Negative for abdominal pain, nausea and vomiting.  Musculoskeletal:  Negative for back pain.  Neurological:  Positive for headaches.  All other systems reviewed and are negative.   Updated Vital Signs BP 137/74   Pulse (!) 51   Temp 97.8 F (36.6 C) (Oral)   Resp 17   SpO2 92%   Physical Exam Vitals and nursing note reviewed.  Constitutional:      Appearance: Normal appearance.  HENT:     Head: Normocephalic.     Comments: Small palpable goose egg to the temporal area.     Mouth/Throat:     Mouth: Mucous membranes are moist.  Eyes:     Comments: Pupils are equal and reactive.   Cardiovascular:     Rate and Rhythm: Normal rate.  Pulmonary:     Effort: Pulmonary effort is normal.     Breath sounds: No wheezing.  Abdominal:     General: Abdomen is flat.  Musculoskeletal:     Cervical back: Normal range of motion.  Skin:    General: Skin is warm and dry.  Neurological:  Mental Status: She is alert and oriented to person, place, and time.     Comments: Alert, oriented, thought content appropriate. Speech fluent without evidence of aphasia. Able to follow 2 step commands without difficulty.  Cranial Nerves:  II:  Peripheral visual fields grossly normal, pupils, round, reactive to light III,IV, VI: ptosis not present, extra-ocular motions intact bilaterally  V,VII: smile symmetric, facial light touch sensation equal VIII: hearing grossly normal bilaterally  IX,X: midline uvula rise  XI: bilateral shoulder shrug equal and strong XII: midline tongue extension  Motor:  5/5 in upper and lower extremities bilaterally including strong and equal grip strength and dorsiflexion/plantar flexion Sensory: light  touch normal in all extremities.  Cerebellar: normal finger-to-nose with bilateral upper extremities, pronator drift negative Gait: N/A       (all labs ordered are listed, but only abnormal results are displayed) Labs Reviewed  COMPREHENSIVE METABOLIC PANEL WITH GFR - Abnormal; Notable for the following components:      Result Value   Creatinine, Ser 1.05 (*)    GFR, Estimated 56 (*)    All other components within normal limits  CBC WITH DIFFERENTIAL/PLATELET  TSH  MAGNESIUM  TROPONIN T, HIGH SENSITIVITY  TROPONIN T, HIGH SENSITIVITY    EKG: None  Radiology: DG Chest Portable 1 View Result Date: 12/09/2023 EXAM: 1 VIEW(S) XRAY OF THE CHEST 12/09/2023 06:58:00 PM COMPARISON: CT chest dated 02/10/2023. CLINICAL HISTORY: fall. Patient states fall yesterday on a boat. States hit head. Reports blurred vision and headache since fall yesterday. Also reports pain to left ribs. FINDINGS: LUNGS AND PLEURA: No focal pulmonary opacity. No pulmonary edema. No pleural effusion. No pneumothorax. HEART AND MEDIASTINUM: Atherosclerotic plaque. No acute abnormality of the cardiac and mediastinal silhouettes. BONES AND SOFT TISSUES: No acute osseous abnormality. IMPRESSION: 1. No acute cardiopulmonary process. Electronically signed by: Pinkie Pebbles MD 12/09/2023 07:18 PM EDT RP Workstation: [id number]   CT Cervical Spine Wo Contrast Result Date: 12/09/2023 CLINICAL DATA:  Status post fall. EXAM: CT CERVICAL SPINE WITHOUT CONTRAST TECHNIQUE: Multidetector CT imaging of the cervical spine was performed without intravenous contrast. Multiplanar CT image reconstructions were also generated. RADIATION DOSE REDUCTION: This exam was performed according to the departmental dose-optimization program which includes automated exposure control, adjustment of the mA and/or kV according to patient size and/or use of iterative reconstruction technique. COMPARISON:  None Available. FINDINGS: Alignment: There is  mild reversal of the normal cervical spine lordosis. 1 mm anterolisthesis of the C4 vertebral body is noted on C5 with approximately 1 mm retrolisthesis of C6 on C7. Skull base and vertebrae: No acute fracture. No primary bone lesion or focal pathologic process. Soft tissues and spinal canal: No prevertebral fluid or swelling. No visible canal hematoma. Disc levels: Moderate severity endplate sclerosis, anterior osteophyte formation and posterior bony spurring are seen at the level of C6-C7, with mild anterior osteophyte formation present at C4-C5 and C5-C6. There is marked severity narrowing of the anterior atlantoaxial articulation, with marked severity intervertebral disc space narrowing at C6-C7. Bilateral marked severity multilevel facet joint hypertrophy is noted. Upper chest: There is moderate severity biapical scarring and/or atelectasis, left greater than right. Other: None. IMPRESSION: 1. No acute fracture or traumatic subluxation. 2. Marked severity multilevel degenerative changes, most prominent at the level of C6-C7. 3. Moderate severity biapical scarring and/or atelectasis, left greater than right. Electronically Signed   By: Suzen Dials M.D.   On: 12/09/2023 17:32   CT Head Wo Contrast Result Date: 12/09/2023 CLINICAL DATA:  Status post fall. EXAM: CT HEAD WITHOUT CONTRAST TECHNIQUE: Contiguous axial images were obtained from the base of the skull through the vertex without intravenous contrast. RADIATION DOSE REDUCTION: This exam was performed according to the departmental dose-optimization program which includes automated exposure control, adjustment of the mA and/or kV according to patient size and/or use of iterative reconstruction technique. COMPARISON:  May 15, 2023 FINDINGS: Brain: There is generalized cerebral atrophy with widening of the extra-axial spaces and ventricular dilatation. There are areas of decreased attenuation within the white matter tracts of the supratentorial  brain, consistent with microvascular disease changes. Vascular: Moderate to marked severity bilateral cavernous carotid artery calcification is noted. Skull: Normal. Negative for fracture or focal lesion. Sinuses/Orbits: No acute finding. Other: None. IMPRESSION: 1. Generalized cerebral atrophy and microvascular disease changes of the supratentorial brain. 2. No acute intracranial abnormality. Electronically Signed   By: Suzen Dials M.D.   On: 12/09/2023 17:26     Procedures   Medications Ordered in the ED - No data to display                                  Medical Decision Making Amount and/or Complexity of Data Reviewed Labs: ordered. Radiology: ordered.    Patient presented to the ED status post mechanical fall which occurred yesterday while she was standing on the boat and lost her footing after going off a 2 foot step off.  She reports falling backward striking the right side of her head.  Does have a prior history of CVA in the month of March of this year.  Has had a headache all day, had some blurry vision after the incident occur, some blurry vision this morning but this has now improved.  She ambulated to the emergency department with a steady gait.  Her vitals are within normal limits, she does have some bradycardia noted, she reports that her resting heart rate has been around the 60s however it does appear particularly low today.  Due to patient's bradycardia some lab work was obtained CBC without any leukocytosis.  CMP with electrolyte derangement to account for this bradycardia.  Creatinine level is slightly elevated with discuss hydration.  FTs are within normal limits.  Magnesium level is normal.  EKG is sinus bradycardia.  Troponin obtained was negative.  She is not having any chest pain, no shortness of breath, no fatigue.  She is a daily smoker about 6 to 7 cigarettes, chest x-ray obtained did not show any concerns for infection versus malignancy.  Discussed these  results with patient.  We discussed symptomatic bradycardia and appropriate follow-up with cardiology if she ever begins to have symptoms.  Her heart rate has now improved to 51.  She tells me she has not eaten much today.  I do feel that patient is hemodynamically stable at this time for outpatient workup.  Stable for discharge.  Portions of this note were generated with Scientist, clinical (histocompatibility and immunogenetics). Dictation errors may occur despite best attempts at proofreading.   Final diagnoses:  Fall, initial encounter  Injury of head, initial encounter  Bradycardia    ED Discharge Orders     None          Maureen Broad, DEVONNA 12/09/23 [id number]    Tegeler, Lonni PARAS, MD 12/09/23 [id number]

## 2023-12-09 NOTE — ED Triage Notes (Signed)
 Patient states fall yesterday on a boat. States hit head. Reports blurred vision and headache since fall yesterday. Also reports pain to left ribs.

## 2023-12-09 NOTE — Discharge Instructions (Addendum)
 The CT of your head, along with cervical spine did not show any acute findings today.  You may take some Tylenol  to help with your pain.  You experience any worsening headache, any changes in your gait, behavior you will need to return to the emergency department.

## 2023-12-09 NOTE — ED Notes (Signed)
 Reviewed discharge instructions and follow-up care with pt. Pt verbalized understanding and had no further questions. Pt exited ED without complications.
